# Patient Record
Sex: Female | Born: 1955
Health system: Southern US, Community
[De-identification: ages and names within clinical notes are randomized; demographics above are authoritative.]

## PROBLEM LIST (undated history)

## (undated) DIAGNOSIS — S92909A Unspecified fracture of unspecified foot, initial encounter for closed fracture: Secondary | ICD-10-CM

## (undated) DIAGNOSIS — K219 Gastro-esophageal reflux disease without esophagitis: Secondary | ICD-10-CM

## (undated) DIAGNOSIS — Z860101 Personal history of adenomatous and serrated colon polyps: Secondary | ICD-10-CM

## (undated) DIAGNOSIS — R351 Nocturia: Secondary | ICD-10-CM

## (undated) DIAGNOSIS — Z86010 Personal history of colonic polyps: Secondary | ICD-10-CM

## (undated) DIAGNOSIS — M8580 Other specified disorders of bone density and structure, unspecified site: Principal | ICD-10-CM

## (undated) DIAGNOSIS — K642 Third degree hemorrhoids: Secondary | ICD-10-CM

## (undated) DIAGNOSIS — E559 Vitamin D deficiency, unspecified: Secondary | ICD-10-CM

## (undated) DIAGNOSIS — R7989 Other specified abnormal findings of blood chemistry: Secondary | ICD-10-CM

## (undated) DIAGNOSIS — H269 Unspecified cataract: Secondary | ICD-10-CM

## (undated) DIAGNOSIS — Z5189 Encounter for other specified aftercare: Secondary | ICD-10-CM

## (undated) HISTORY — PX: CATARACT EXTRACTION W/ INTRAOCULAR LENS IMPLANT: SHX1309

## (undated) HISTORY — PX: ABDOMINAL SACROCOLPOPEXY: SHX1114

## (undated) HISTORY — DX: Unspecified cataract: H26.9

## (undated) HISTORY — DX: Other specified abnormal findings of blood chemistry: R79.89

## (undated) HISTORY — DX: Encounter for other specified aftercare: Z51.89

## (undated) HISTORY — PX: COLONOSCOPY: SHX174

## (undated) HISTORY — DX: Unspecified fracture of unspecified foot, initial encounter for closed fracture: S92.909A

## (undated) HISTORY — DX: Other specified disorders of bone density and structure, unspecified site: M85.80

---

## 2000-02-02 HISTORY — PX: CYST REMOVAL NECK: SHX6281

## 2000-05-09 ENCOUNTER — Encounter: Payer: Self-pay | Admitting: *Deleted

## 2000-05-09 ENCOUNTER — Encounter: Admission: RE | Admit: 2000-05-09 | Discharge: 2000-05-09 | Payer: Self-pay | Admitting: *Deleted

## 2000-07-01 ENCOUNTER — Encounter (INDEPENDENT_AMBULATORY_CARE_PROVIDER_SITE_OTHER): Payer: Self-pay | Admitting: Specialist

## 2000-07-01 ENCOUNTER — Other Ambulatory Visit: Admission: RE | Admit: 2000-07-01 | Discharge: 2000-07-01 | Payer: Self-pay | Admitting: *Deleted

## 2004-02-02 DIAGNOSIS — Z5189 Encounter for other specified aftercare: Secondary | ICD-10-CM

## 2004-02-02 HISTORY — DX: Encounter for other specified aftercare: Z51.89

## 2004-05-14 ENCOUNTER — Emergency Department (HOSPITAL_COMMUNITY): Admission: EM | Admit: 2004-05-14 | Discharge: 2004-05-14 | Payer: Self-pay | Admitting: Emergency Medicine

## 2004-05-20 ENCOUNTER — Ambulatory Visit (HOSPITAL_COMMUNITY): Admission: RE | Admit: 2004-05-20 | Discharge: 2004-05-20 | Payer: Self-pay | Admitting: Family Medicine

## 2006-02-01 HISTORY — PX: CATARACT EXTRACTION: SUR2

## 2008-02-02 LAB — HM PAP SMEAR

## 2008-11-01 HISTORY — PX: ABDOMINAL HYSTERECTOMY: SHX81

## 2008-11-26 ENCOUNTER — Ambulatory Visit (HOSPITAL_COMMUNITY): Admission: RE | Admit: 2008-11-26 | Discharge: 2008-11-26 | Payer: Self-pay | Admitting: Obstetrics and Gynecology

## 2008-11-26 ENCOUNTER — Encounter: Payer: Self-pay | Admitting: Obstetrics and Gynecology

## 2008-11-26 HISTORY — PX: ROBOTIC ASSISTED TOTAL HYSTERECTOMY: SHX6085

## 2010-01-28 ENCOUNTER — Ambulatory Visit: Payer: Self-pay | Admitting: Family Medicine

## 2010-05-07 LAB — CBC
HCT: 44 % (ref 36.0–46.0)
Hemoglobin: 15 g/dL (ref 12.0–15.0)
MCHC: 34.1 g/dL (ref 30.0–36.0)
MCV: 93.6 fL (ref 78.0–100.0)
Platelets: 223 10*3/uL (ref 150–400)
RBC: 4.7 MIL/uL (ref 3.87–5.11)
RDW: 12.9 % (ref 11.5–15.5)
WBC: 5 10*3/uL (ref 4.0–10.5)

## 2010-05-07 LAB — TYPE AND SCREEN: Antibody Screen: NEGATIVE

## 2010-05-07 LAB — ABO/RH: ABO/RH(D): A POS

## 2010-06-02 HISTORY — PX: ROBOTIC ASSISTED LAPAROSCOPIC SACROCOLPOPEXY: SHX5388

## 2010-07-01 HISTORY — PX: ROBOTIC ASSISTED LAPAROSCOPIC SACROCOLPOPEXY: SHX5388

## 2011-06-02 ENCOUNTER — Encounter: Payer: Self-pay | Admitting: Obstetrics and Gynecology

## 2011-08-30 LAB — HM MAMMOGRAPHY: HM Mammogram: NEGATIVE

## 2011-09-01 ENCOUNTER — Encounter: Payer: Self-pay | Admitting: Internal Medicine

## 2012-05-08 ENCOUNTER — Encounter: Payer: Self-pay | Admitting: Family Medicine

## 2012-07-07 ENCOUNTER — Encounter: Payer: Self-pay | Admitting: Internal Medicine

## 2012-07-07 LAB — HM COLONOSCOPY

## 2012-11-23 ENCOUNTER — Telehealth: Payer: Self-pay | Admitting: Obstetrics and Gynecology

## 2012-11-23 NOTE — Telephone Encounter (Signed)
[employer] calling  For claim 04/06/12 is being requested to be resent and they are behind right now and she just wanted to make sure that the pt wasn't being sent to collections. Any questions please call and wanted [place] to advise the pt too.

## 2012-12-27 ENCOUNTER — Other Ambulatory Visit (INDEPENDENT_AMBULATORY_CARE_PROVIDER_SITE_OTHER): Payer: BC Managed Care – PPO

## 2012-12-27 DIAGNOSIS — Z23 Encounter for immunization: Secondary | ICD-10-CM

## 2013-04-09 ENCOUNTER — Encounter: Payer: Self-pay | Admitting: Obstetrics and Gynecology

## 2013-04-09 ENCOUNTER — Ambulatory Visit (INDEPENDENT_AMBULATORY_CARE_PROVIDER_SITE_OTHER): Payer: BC Managed Care – PPO | Admitting: Obstetrics and Gynecology

## 2013-04-09 VITALS — BP 100/68 | HR 64 | Resp 16 | Ht 62.0 in | Wt 127.5 lb

## 2013-04-09 DIAGNOSIS — Z0000 Encounter for general adult medical examination without abnormal findings: Secondary | ICD-10-CM

## 2013-04-09 DIAGNOSIS — I781 Nevus, non-neoplastic: Secondary | ICD-10-CM

## 2013-04-09 DIAGNOSIS — Z01419 Encounter for gynecological examination (general) (routine) without abnormal findings: Secondary | ICD-10-CM

## 2013-04-09 DIAGNOSIS — E559 Vitamin D deficiency, unspecified: Secondary | ICD-10-CM

## 2013-04-09 DIAGNOSIS — T83711A Erosion of implanted vaginal mesh and other prosthetic materials to surrounding organ or tissue, initial encounter: Secondary | ICD-10-CM

## 2013-04-09 DIAGNOSIS — D1801 Hemangioma of skin and subcutaneous tissue: Secondary | ICD-10-CM

## 2013-04-09 DIAGNOSIS — E785 Hyperlipidemia, unspecified: Secondary | ICD-10-CM

## 2013-04-09 LAB — CBC
HCT: 41.9 % (ref 36.0–46.0)
HEMOGLOBIN: 14.7 g/dL (ref 12.0–15.0)
MCH: 32.1 pg (ref 26.0–34.0)
MCHC: 35.1 g/dL (ref 30.0–36.0)
MCV: 91.5 fL (ref 78.0–100.0)
Platelets: 204 10*3/uL (ref 150–400)
RBC: 4.58 MIL/uL (ref 3.87–5.11)
RDW: 13.3 % (ref 11.5–15.5)
WBC: 4.6 10*3/uL (ref 4.0–10.5)

## 2013-04-09 LAB — LIPID PANEL
CHOLESTEROL: 163 mg/dL (ref 0–200)
HDL: 48 mg/dL (ref >39)
LDL CALC: 100 mg/dL — AB (ref 0–99)
TRIGLYCERIDES: 75 mg/dL (ref <150)
Total CHOL/HDL Ratio: 3.4 Ratio
VLDL: 15 mg/dL (ref 0–40)

## 2013-04-09 LAB — POCT URINALYSIS DIPSTICK
BILIRUBIN UA: NEGATIVE
Blood, UA: NEGATIVE
GLUCOSE UA: NEGATIVE
KETONES UA: NEGATIVE
LEUKOCYTES UA: NEGATIVE
NITRITE UA: NEGATIVE
PH UA: 5
Protein, UA: NEGATIVE
Urobilinogen, UA: NEGATIVE

## 2013-04-09 LAB — COMPREHENSIVE METABOLIC PANEL
ALBUMIN: 4.2 g/dL (ref 3.5–5.2)
ALT: 19 U/L (ref 0–35)
AST: 23 U/L (ref 0–37)
Alkaline Phosphatase: 58 U/L (ref 39–117)
BUN: 15 mg/dL (ref 6–23)
CALCIUM: 9.2 mg/dL (ref 8.4–10.5)
CHLORIDE: 103 meq/L (ref 96–112)
CO2: 33 meq/L — AB (ref 19–32)
Creat: 0.76 mg/dL (ref 0.50–1.10)
Glucose, Bld: 93 mg/dL (ref 70–99)
POTASSIUM: 4.4 meq/L (ref 3.5–5.3)
Sodium: 139 mEq/L (ref 135–145)
Total Bilirubin: 0.8 mg/dL (ref 0.2–1.2)
Total Protein: 7.2 g/dL (ref 6.0–8.3)

## 2013-04-09 LAB — HEMOGLOBIN, FINGERSTICK: HEMOGLOBIN, FINGERSTICK: 14.5 g/dL (ref 12.0–16.0)

## 2013-04-09 LAB — HEMOGLOBIN A1C
HEMOGLOBIN A1C: 5.7 % — AB (ref <5.7)
MEAN PLASMA GLUCOSE: 117 mg/dL — AB (ref <117)

## 2013-04-09 NOTE — Progress Notes (Signed)
Patient ID: Laura Baker, female   DOB: 12/24/1955, 58 y.o.   MRN: [id number] GYNECOLOGY VISIT  PCP:   Jill Alexanders, MD  Referring provider:   HPI: 58 y.o.   Married  Caucasian  female   [phone] with No LMP recorded. Patient has had a hysterectomy.   here for annual exam. Patient had a vaginal cuff erosion from the mesh.  Dr. Yolonda Kida removed a stitch in the office.  Sees him every 6 months.  Has a prescription of Estrace cream from him.  No problems with bladder and bowel function.   Wants labs for work.   Wants evaluation for lesions on face.   Hgb:    14.5 Urine:  Neg  GYNECOLOGIC HISTORY: No LMP recorded. Patient has had a hysterectomy.  Ovaries remain.  Sexually active:  yes Partner preference: female Contraception:  hysterectomy  Menopausal hormone therapy: Estrace vaginal cream twice weekly DES exposure:   no Blood transfusions:  Yes in 2006 unclear of reason  Sexually transmitted diseases:   no GYN procedures and prior surgeries:  Hysterectomy, Robotic assisted laparoscopic sacrocolpopexy 06/2010. Last mammogram:   08-30-11 VPX:[id number]              Last pap and high risk HPV testing:  2010 wnl:no HPV testing  History of abnormal pap smear:  no   OB History   Grav Para Term Preterm Abortions TAB SAB Ect Mult Living   3 3 3       3        LIFESTYLE: Exercise:   Walking.              Tobacco:   no Alcohol:     no Drug use:  no  OTHER HEALTH MAINTENANCE: Tetanus/TDap:   2010 Gardisil:              n/a Influenza:            With PCP Zostavax:             n/a  Bone density:      never Colonoscopy:      07-07-12 with Dr. Collene Mares revealed polyp.  Next colonoscopy 07/2017.  Cholesterol check:  2014 wnl   Family History  Problem Relation Age of Onset  . Hypertension Mother   . Congestive Heart Failure Mother   . Thyroid disease Mother   . Osteoporosis Mother   . Stroke Mother   . Diabetes Mother   . Lung cancer Father   . Diabetes Sister   . Thyroid disease  Sister   . Osteoporosis Sister   . Thyroid disease Brother   . Leukemia Sister   . Thyroid disease Sister   . Diabetes Sister   . Kidney disease Sister   . Scleroderma Sister   . Thyroid disease Brother   . Rashes / Skin problems Other     There are no active problems to display for this patient.  Past Medical History  Diagnosis Date  . Blood transfusion without reported diagnosis 2006    Past Surgical History  Procedure Laterality Date  . Robotic assisted laparoscopic sacrocolpopexy  06/2010    --L.[facility]  . Abdominal hysterectomy  11/2008  . Cyst removal neck  2002  . Cataract surgery  2008    ALLERGIES: Sulfa antibiotics  Current Outpatient Prescriptions  Medication Sig Dispense Refill  . cholecalciferol (VITAMIN D) 1000 UNITS tablet Take 1,000 Units by mouth daily.      Marland Kitchen ESTRACE VAGINAL 0.1 MG/GM vaginal cream  Place 1 Applicatorful vaginally 2 (two) times a week.       No current facility-administered medications for this visit.     ROS:  Pertinent items are noted in HPI.  SOCIAL HISTORY:  International aid/development worker.   PHYSICAL EXAMINATION:    BP 100/68  Pulse 64  Resp 16  Ht 5\' 2"  (1.575 m)  Wt 127 lb 8 oz (57.834 kg)  BMI 23.31 kg/m2   Wt Readings from Last 3 Encounters:  04/09/13 127 lb 8 oz (57.834 kg)     Ht Readings from Last 3 Encounters:  04/09/13 5\' 2"  (1.575 m)    General appearance: alert, cooperative and appears stated age Head: Normocephalic, without obvious abnormality, atraumatic Neck: no adenopathy, supple, symmetrical, trachea midline and thyroid not enlarged, symmetric, no tenderness/mass/nodules Lungs: clear to auscultation bilaterally Breasts: Inspection negative, No nipple retraction or dimpling, No nipple discharge or bleeding, No axillary or supraclavicular adenopathy, Normal to palpation without dominant masses Heart: regular rate and rhythm Abdomen: soft, non-tender; no masses,  no organomegaly Extremities: extremities normal, atraumatic,  no cyanosis or edema Skin: Skin color, texture, turgor normal. Cherry hemangiomas of face and vulva.  Lymph nodes: Cervical, supraclavicular, and axillary nodes normal. No abnormal inguinal nodes palpated Neurologic: Grossly normal  Pelvic: External genitalia:  Multiple cherry hemangiomas.               Urethra:  normal appearing urethra with no masses, tenderness or lesions              Bartholins and Skenes: normal                 Vagina:  Right vaginal cuff mesh erosion and suture visible.              Cervix: absent              Pap and high risk HPV testing done: no.            Bimanual Exam:  Uterus:  Absent.                                       Adnexa: normal adnexa in size, nontender and no masses                                      Rectovaginal: Confirms                                      Anus:  normal sphincter tone, no lesions  ASSESSMENT  Normal gynecologic exam. Right vaginal cuff mesh erosion.  Cherry hemangiomas.   Hyperlipidemia.  Family history of thyroid disease.  PLAN  Mammogram recommended yearly.  Patient will call [facility].  Pap smear and high risk HPV testing not indicated. Patient educated to vaginal cuff mesh erosion.   Follow up with Dr. Yolonda Kida.   Continue vaginal Estrace.  Has Rx from Dr. Yolonda Kida.  Referral to Dermatology for cherry hemangiomas of face.  Lipid profile, CMP, HgbA1C, TSH, CBC, Vit D.  Counseled on self breast exam, Calcium and vitamin D intake, exercise. Return annually or prn   An After Visit Summary was printed and given to the patient.

## 2013-04-09 NOTE — Patient Instructions (Signed)

## 2013-04-10 LAB — VITAMIN D 25 HYDROXY (VIT D DEFICIENCY, FRACTURES): VIT D 25 HYDROXY: 63 ng/mL (ref 30–89)

## 2013-04-10 LAB — TSH: TSH: 2.369 u[IU]/mL (ref 0.350–4.500)

## 2013-04-11 ENCOUNTER — Telehealth: Payer: Self-pay | Admitting: Obstetrics and Gynecology

## 2013-04-11 NOTE — Telephone Encounter (Signed)
Advised patient that she is schedule with [facility] on May 18th @ [phone]

## 2013-06-18 ENCOUNTER — Encounter: Payer: Self-pay | Admitting: Internal Medicine

## 2013-06-18 LAB — HM MAMMOGRAPHY: HM Mammogram: NEGATIVE

## 2013-11-14 ENCOUNTER — Telehealth: Payer: Self-pay | Admitting: Obstetrics and Gynecology

## 2013-11-14 NOTE — Telephone Encounter (Signed)
Pt wants to know if she can come in for a morning appointment for fasting labs on 04/12/14. She has her annual scheduled at 1:30 on that day. Will she need an order?

## 2013-11-14 NOTE — Telephone Encounter (Signed)
Left message to call [place] at [phone].   Triage to make sure no concerns or problems. Are there any labs that she is interested in? At last AEX patient had lipid panel, TSh, CMP, CBC, Vit D, and HA1c done.

## 2013-11-16 NOTE — Telephone Encounter (Signed)
Patient wanted to have annual fasting blood work and annual exam at the same time. Offered earlier annual exam appointment the same day for 1130 and can discuss what types of lab work she will need with Dr. Quincy Simmonds at time of appointment. Patient is agreeable to this. Routing to provider for final review. Patient agreeable to disposition. Will close encounter

## 2013-12-03 ENCOUNTER — Encounter: Payer: Self-pay | Admitting: Obstetrics and Gynecology

## 2014-03-28 ENCOUNTER — Telehealth: Payer: Self-pay | Admitting: Obstetrics and Gynecology

## 2014-03-28 DIAGNOSIS — Z0189 Encounter for other specified special examinations: Secondary | ICD-10-CM

## 2014-03-28 NOTE — Telephone Encounter (Signed)
Pt has an aex scheduled 04/12/14. Pt states she is bringing forms for insurance that she needs filled out. Pt would like to make sure any blood work is back that day to fill out forms and asks when she should have labs.  Pt states she will not be home today and requests call tomorrow 03/29/14 to discuss.

## 2014-03-29 NOTE — Telephone Encounter (Signed)
I will approve the patient's request for these labs.  Please place a future order.

## 2014-03-29 NOTE — Telephone Encounter (Signed)
Left message to call [place] at [phone]. 

## 2014-03-29 NOTE — Telephone Encounter (Signed)
Returning call.

## 2014-03-29 NOTE — Telephone Encounter (Signed)
Spoke with patient. Patient states that she would like to come in early for lab work so that it will be available at her annual on 3/11. Lab appointment scheduled for 3/9 at 9am. Patient is agreeable to date and time. Patient requesting to have all labs from last year repeated which include;lipid panel, TSH, CMP, CBC, Vit D, and HgbA1c. Orders pending Dr.Silva's review.

## 2014-03-29 NOTE — Telephone Encounter (Signed)
Orders placed.  Will close encounter.

## 2014-04-10 ENCOUNTER — Other Ambulatory Visit (INDEPENDENT_AMBULATORY_CARE_PROVIDER_SITE_OTHER): Payer: BLUE CROSS/BLUE SHIELD

## 2014-04-10 DIAGNOSIS — Z0000 Encounter for general adult medical examination without abnormal findings: Secondary | ICD-10-CM

## 2014-04-10 DIAGNOSIS — Z0189 Encounter for other specified special examinations: Secondary | ICD-10-CM

## 2014-04-10 LAB — COMPREHENSIVE METABOLIC PANEL
ALK PHOS: 60 U/L (ref 39–117)
ALT: 20 U/L (ref 0–35)
AST: 24 U/L (ref 0–37)
Albumin: 3.9 g/dL (ref 3.5–5.2)
BUN: 14 mg/dL (ref 6–23)
CALCIUM: 9.5 mg/dL (ref 8.4–10.5)
CO2: 26 mEq/L (ref 19–32)
Chloride: 102 mEq/L (ref 96–112)
Creat: 0.8 mg/dL (ref 0.50–1.10)
GLUCOSE: 84 mg/dL (ref 70–99)
Potassium: 4.4 mEq/L (ref 3.5–5.3)
Sodium: 141 mEq/L (ref 135–145)
TOTAL PROTEIN: 7.1 g/dL (ref 6.0–8.3)
Total Bilirubin: 0.6 mg/dL (ref 0.2–1.2)

## 2014-04-10 LAB — CBC WITH DIFFERENTIAL/PLATELET
BASOS PCT: 1 % (ref 0–1)
Basophils Absolute: 0 10*3/uL (ref 0.0–0.1)
Eosinophils Absolute: 0.1 10*3/uL (ref 0.0–0.7)
Eosinophils Relative: 3 % (ref 0–5)
HCT: 43.9 % (ref 36.0–46.0)
HEMOGLOBIN: 14.9 g/dL (ref 12.0–15.0)
LYMPHS PCT: 27 % (ref 12–46)
Lymphs Abs: 1.3 10*3/uL (ref 0.7–4.0)
MCH: 31.2 pg (ref 26.0–34.0)
MCHC: 33.9 g/dL (ref 30.0–36.0)
MCV: 92 fL (ref 78.0–100.0)
MONO ABS: 0.4 10*3/uL (ref 0.1–1.0)
MONOS PCT: 8 % (ref 3–12)
MPV: 9.9 fL (ref 8.6–12.4)
NEUTROS ABS: 2.9 10*3/uL (ref 1.7–7.7)
NEUTROS PCT: 61 % (ref 43–77)
PLATELETS: 250 10*3/uL (ref 150–400)
RBC: 4.77 MIL/uL (ref 3.87–5.11)
RDW: 13.5 % (ref 11.5–15.5)
WBC: 4.7 10*3/uL (ref 4.0–10.5)

## 2014-04-10 LAB — LIPID PANEL
CHOL/HDL RATIO: 3.1 ratio
CHOLESTEROL: 149 mg/dL (ref 0–200)
HDL: 48 mg/dL (ref >=46)
LDL Cholesterol: 77 mg/dL (ref 0–99)
Triglycerides: 118 mg/dL (ref <150)
VLDL: 24 mg/dL (ref 0–40)

## 2014-04-10 LAB — TSH: TSH: 2.317 u[IU]/mL (ref 0.350–4.500)

## 2014-04-10 LAB — HEMOGLOBIN A1C
Hgb A1c MFr Bld: 5.6 % (ref <5.7)
Mean Plasma Glucose: 114 mg/dL (ref <117)

## 2014-04-11 LAB — VITAMIN D 25 HYDROXY (VIT D DEFICIENCY, FRACTURES): Vit D, 25-Hydroxy: 29 ng/mL — ABNORMAL LOW (ref 30–100)

## 2014-04-12 ENCOUNTER — Encounter: Payer: Self-pay | Admitting: Obstetrics and Gynecology

## 2014-04-12 ENCOUNTER — Ambulatory Visit: Payer: BC Managed Care – PPO | Admitting: Obstetrics and Gynecology

## 2014-04-12 ENCOUNTER — Ambulatory Visit (INDEPENDENT_AMBULATORY_CARE_PROVIDER_SITE_OTHER): Payer: BLUE CROSS/BLUE SHIELD | Admitting: Obstetrics and Gynecology

## 2014-04-12 ENCOUNTER — Telehealth: Payer: Self-pay | Admitting: Obstetrics and Gynecology

## 2014-04-12 VITALS — BP 118/80 | HR 80 | Resp 18 | Ht 62.0 in | Wt 124.4 lb

## 2014-04-12 DIAGNOSIS — Z0000 Encounter for general adult medical examination without abnormal findings: Secondary | ICD-10-CM | POA: Diagnosis not present

## 2014-04-12 DIAGNOSIS — T8589XD Other specified complication of internal prosthetic devices, implants and grafts, not elsewhere classified, subsequent encounter: Secondary | ICD-10-CM | POA: Diagnosis not present

## 2014-04-12 DIAGNOSIS — E559 Vitamin D deficiency, unspecified: Secondary | ICD-10-CM | POA: Diagnosis not present

## 2014-04-12 DIAGNOSIS — T83711D Erosion of implanted vaginal mesh and other prosthetic materials to surrounding organ or tissue, subsequent encounter: Secondary | ICD-10-CM

## 2014-04-12 DIAGNOSIS — Z01419 Encounter for gynecological examination (general) (routine) without abnormal findings: Secondary | ICD-10-CM

## 2014-04-12 LAB — POCT URINALYSIS DIPSTICK
BILIRUBIN UA: NEGATIVE
Blood, UA: NEGATIVE
GLUCOSE UA: NEGATIVE
Ketones, UA: NEGATIVE
Leukocytes, UA: NEGATIVE
Nitrite, UA: NEGATIVE
Protein, UA: NEGATIVE
UROBILINOGEN UA: NEGATIVE
pH, UA: 5

## 2014-04-12 MED ORDER — ESTRADIOL 0.1 MG/GM VA CREA: TOPICAL_CREAM | VAGINAL | Status: DC

## 2014-04-12 NOTE — Telephone Encounter (Signed)
yes

## 2014-04-12 NOTE — Telephone Encounter (Signed)
Patient scheduled her appointment for aex 3/13/17w/ Laura Baker next year (due to patient schedule). Patient asked to come in a few days prior for labs. Patient scheduled for labs 04/10/15.  Patient had aex today with Dr.Silva, need order for next years labs.

## 2014-04-12 NOTE — Progress Notes (Signed)
Patient ID: Laura Baker, female   DOB: 01-26-1956, 59 y.o.   MRN: [id number] 59 y.o. [id number] MarriedCaucasianF here for annual exam.   PCP:  Jill Alexanders, MD  Had blood work done in preparation for the visit today.  Labs normal except vit D 29.  Needs them for work.  Brings in paperwork to be signed today for work that need to be turned in beginning of next week.   History of mesh erosion from prior sacrocolpopexy.  Saw her GYN in [place] who prescribed estrogen cream and told it was OK and to call if she developed bleeding.  Using Estrace 1/2 gram pv three times a day.  No pain with intercourse.  No vaginal bleeding.   Babysits for two children. Daughter just married, and patient is hoping for grandchildren.    No LMP recorded. Patient has had a hysterectomy.          Sexually active: Yes.   female partner The current method of family planning is status post hysterectomy.   Ovaries remain.  Exercising: Yes.    walking. Smoker:  no  Health Maintenance: Pap: 2010 wnl History of abnormal Pap:  no MMG:  06-18-13 heterogeneously dense/nl:[facility] Colonoscopy:  07-07-12 revealed a polyp with Dr. Collene Mares. Next due 07/2017. BMD:   never TDaP:  2010 Screening Labs: Hb today: 14.9, Urine today: Neg   reports that she has never smoked. She does not have any smokeless tobacco history on file. She reports that she does not drink alcohol or use illicit drugs.  Past Medical History  Diagnosis Date  . Blood transfusion without reported diagnosis 2006    Past Surgical History  Procedure Laterality Date  . Robotic assisted laparoscopic sacrocolpopexy  06/2010    --L[facility]  . Abdominal hysterectomy  11/2008  . Cyst removal neck  2002  . Cataract surgery  2008    Current Outpatient Prescriptions  Medication Sig Dispense Refill  . Multiple Vitamins-Minerals (CENTRUM SILVER ADULT 50+ PO) Take 1 tablet by mouth daily.    Marland Kitchen ESTRACE VAGINAL 0.1 MG/GM vaginal cream Place 1 Applicatorful  vaginally 2 (two) times a week.     No current facility-administered medications for this visit.    Family History  Problem Relation Age of Onset  . Hypertension Mother   . Congestive Heart Failure Mother   . Thyroid disease Mother   . Osteoporosis Mother   . Stroke Mother   . Diabetes Mother   . Lung cancer Father   . Diabetes Sister   . Thyroid disease Sister   . Osteoporosis Sister   . Thyroid disease Brother   . Leukemia Sister   . Thyroid disease Sister   . Diabetes Sister   . Kidney disease Sister   . Scleroderma Sister   . Thyroid disease Brother   . Rashes / Skin problems Other     ROS:  Pertinent items are noted in HPI.  Otherwise, a comprehensive ROS was negative.  Exam:   BP 118/80 mmHg  Pulse 80  Resp 18  Ht 5\' 2"  (1.575 m)  Wt 124 lb 6.4 oz (56.427 kg)  BMI 22.75 kg/m2     Height: 5\' 2"  (157.5 cm)  Ht Readings from Last 3 Encounters:  04/12/14 5\' 2"  (1.575 m)  04/09/13 5\' 2"  (1.575 m)    General appearance: alert, cooperative and appears stated age Head: Normocephalic, without obvious abnormality, atraumatic Neck: no adenopathy, supple, symmetrical, trachea midline and thyroid normal to inspection and palpation Lungs:  clear to auscultation bilaterally Breasts: normal appearance, no masses or tenderness, Inspection negative, No nipple retraction or dimpling, No nipple discharge or bleeding, No axillary or supraclavicular adenopathy Heart: regular rate and rhythm Abdomen: soft, non-tender; bowel sounds normal; no masses,  no organomegaly Extremities: extremities normal, atraumatic, no cyanosis or edema Skin: Skin color, texture, turgor normal. No rashes or lesions Lymph nodes: Cervical, supraclavicular, and axillary nodes normal. No abnormal inguinal nodes palpated Neurologic: Grossly normal   Pelvic: External genitalia:  no lesions              Urethra:  normal appearing urethra with no masses, tenderness or lesions              Bartholins and  Skenes: normal                 Vagina: normal appearing vagina with normal color and discharge,  Mesh erosion of the right cuff is palpable.               Cervix: absent              Pap taken: No. Bimanual Exam:  Uterus:  uterus absent              Adnexa: no mass, fullness, tenderness               Rectovaginal: Confirms               Anus:  normal sphincter tone, no lesions  Chaperone was present for exam.  A:  Well Woman with normal exam Status post TAH.  Ovaries remain.  Sacrocolpopexy with vaginal mesh erosion.   Borderline low vit D.   P:   Mammogram yearly.  Do self beast exams.  pap smear not indicated.  Rx for Estrace vaginal cream 1/2 gm pv at hs two to three times weekly.  Discussed risk of DVT, PE, MI, stroke, breast cancer.  Recommended adding vit D 1000 IU daily to vitamin routine.  return annually or prn

## 2014-04-12 NOTE — Telephone Encounter (Signed)
All labs ordered for future appointment.  Routing to provider for final review. Patient agreeable to disposition. Will close encounter

## 2014-04-12 NOTE — Patient Instructions (Signed)

## 2014-04-12 NOTE — Telephone Encounter (Signed)
Patient was seen with Dr.Silva for annual on 04/10/2014. Patient had Lipid panel, CMET, TSH, CBC with differential, Vit D, and HgBA1c done. Patient is scheduled to see Regina Eck CNM for next annual on 04/14/2015. Has a lab appointment scheduled for 04/10/2015.   Okay to reorder all labs she had performed at this years appointment for next year?

## 2014-04-19 ENCOUNTER — Ambulatory Visit: Payer: BC Managed Care – PPO | Admitting: Obstetrics and Gynecology

## 2014-05-03 HISTORY — PX: CATARACT EXTRACTION: SUR2

## 2014-06-20 LAB — HM MAMMOGRAPHY: HM MAMMO: NEGATIVE

## 2014-07-15 ENCOUNTER — Encounter: Payer: Self-pay | Admitting: Internal Medicine

## 2014-11-30 ENCOUNTER — Ambulatory Visit (INDEPENDENT_AMBULATORY_CARE_PROVIDER_SITE_OTHER): Payer: BLUE CROSS/BLUE SHIELD | Admitting: Family Medicine

## 2014-11-30 VITALS — BP 118/82 | HR 78 | Temp 98.8°F | Resp 18 | Ht 62.75 in | Wt 130.0 lb

## 2014-11-30 DIAGNOSIS — M62838 Other muscle spasm: Secondary | ICD-10-CM

## 2014-11-30 DIAGNOSIS — M6248 Contracture of muscle, other site: Secondary | ICD-10-CM

## 2014-11-30 MED ORDER — CYCLOBENZAPRINE HCL 5 MG PO TABS: 2.5000 mg | ORAL_TABLET | Freq: Three times a day (TID) | ORAL | Status: DC | PRN

## 2014-11-30 MED ORDER — MELOXICAM 15 MG PO TABS: 15.0000 mg | ORAL_TABLET | Freq: Every day | ORAL | Status: DC

## 2014-11-30 NOTE — Progress Notes (Addendum)
Subjective:    Patient ID: Laura Baker, female    DOB: 09-11-1955, 59 y.o.   MRN: [id number] This chart was scribed for Delman Cheadle, MD by Marti Sleigh, Medical Scribe. This patient was seen in Room 10 and the patient's care was started a 10:24 AM.  Chief Complaint  Patient presents with  . Headache    started on Wednesday, feels a pain that starts on the left side and spreads down her neck, back and arm.      HPI HPI Comments: Laura Baker is a 59 y.o. female who presents to [facility] complaining of pain in the left side of her neck that radiates down the left side of her back. She states the pain started three days ago and initially radiated into her shoulder. She has no past hx of these sx. She took an aleve last night without relief. She endorses associated sleep disturbance and HA. Denies vision changes, generalized weakness, congestion, sinus pressure, weakness or numbness in arms or legs, or loss of bowel or bladder control. She states she changed her pillow two months ago the new pillow is slightly higher than her old one.   Past Medical History  Diagnosis Date  . Blood transfusion without reported diagnosis 2006  . Cataract    Allergies  Allergen Reactions  . Sulfa Antibiotics Hives   Current Outpatient Prescriptions on File Prior to Visit  Medication Sig Dispense Refill  . Multiple Vitamins-Minerals (CENTRUM SILVER ADULT 50+ PO) Take 1 tablet by mouth daily.    Marland Kitchen estradiol (ESTRACE VAGINAL) 0.1 MG/GM vaginal cream Place 1/2 gram per vagina at bedtime 2 - 3 times per week. (Patient not taking: Reported on 11/30/2014) 42.5 g 3   No current facility-administered medications on file prior to visit.    Review of Systems  Constitutional: Negative for fever and chills.  Genitourinary: Negative for dysuria, frequency and flank pain.  Musculoskeletal: Positive for back pain, neck pain and neck stiffness.  Neurological: Positive for headaches. Negative for weakness and  numbness.       Objective:  BP 118/82 mmHg  Pulse 78  Temp(Src) 98.8 F (37.1 C) (Oral)  Resp 18  Ht 5' 2.75" (1.594 m)  Wt 130 lb (58.968 kg)  BMI 23.21 kg/m2  SpO2 98%  Physical Exam  Constitutional: She is oriented to person, place, and time. She appears well-developed and well-nourished. No distress.  HENT:  Head: Normocephalic and atraumatic.  Normal TMs, nares, oropharynx.   Eyes: Pupils are equal, round, and reactive to light.  Neck: Neck supple.  Cardiovascular: Normal rate.   Pulmonary/Chest: Effort normal. No respiratory distress.  Musculoskeletal: Normal range of motion. She exhibits no edema or tenderness.  Neurological: She is alert and oriented to person, place, and time. She has normal strength and normal reflexes. She displays no atrophy and normal reflexes. No cranial nerve deficit or sensory deficit. She exhibits abnormal muscle tone. Coordination and gait normal.  Cranial nerves 2-10 intact, limitation in left  strength due to pain. No point tenderness over cervial or thoracic spinous processes. Negative spurlings. No pre or post auricular adenopathy. No tenderness over mastoid or mandible. Normal thyroid, no cervical adenopathy. Large palpable spasm over the proximal left trapezius muscle. 2+ bicep, tricep and brachioradialis. 5/5 opposition and grasp strength in bilateral hands.   Skin: Skin is warm and dry. No rash noted. She is not diaphoretic.  Psychiatric: She has a normal mood and affect. Her behavior is normal.  Nursing note  and vitals reviewed.     Assessment & Plan:   1. Trapezius muscle spasm   heat and gentle stretching.  Meds ordered this encounter  Medications  . Cholecalciferol (VITAMIN D PO)    Sig: Take by mouth.  . meloxicam (MOBIC) 15 MG tablet    Sig: Take 1 tablet (15 mg total) by mouth daily.    Dispense:  30 tablet    Refill:  0  . cyclobenzaprine (FLEXERIL) 5 MG tablet    Sig: Take 0.5-2 tablets (2.5-10 mg total) by mouth 3  (three) times daily as needed for muscle spasms.    Dispense:  30 tablet    Refill:  1    I personally performed the services described in this documentation, which was scribed in my presence. The recorded information has been reviewed and considered, and addended by me as needed.  Delman Cheadle, MD MPH   By signing my name below, I, Judithe Modest, attest that this documentation has been prepared under the direction and in the presence of Delman Cheadle, MD. Electronically Signed: Judithe Modest, ER Scribe. 11/30/2014. 10:32 AM.

## 2014-11-30 NOTE — Patient Instructions (Signed)
Acute Torticollis Torticollis is a condition in which the muscles of the neck tighten (contract) abnormally, causing the neck to twist and the head to move into an unnatural position. Torticollis that develops suddenly is called acute torticollis. If torticollis becomes chronic and is left untreated, the face and neck can become deformed. CAUSES This condition may be caused by:  Sleeping in an awkward position (common).  Extending or twisting the neck muscles beyond their normal position.  Infection. In some cases, the cause may not be known. SYMPTOMS Symptoms of this condition include:  An unnatural position of the head.  Neck pain.  A limited ability to move the neck.  Twisting of the neck to one side. DIAGNOSIS This condition is diagnosed with a physical exam. You may also have imaging tests, such as an X-ray, CT scan, or MRI. TREATMENT Treatment for this condition involves trying to relax the neck muscles. It may include:  Medicines or shots.  Physical therapy.  Surgery. This may be done in severe cases. HOME CARE INSTRUCTIONS  Take medicines only as directed by your health care provider.  Do stretching exercises and massage your neck as directed by your health care provider.  Keep all follow-up visits as directed by your health care provider. This is important. SEEK MEDICAL CARE IF:  You develop a fever. SEEK IMMEDIATE MEDICAL CARE IF:  You develop difficulty breathing.  You develop noisy breathing (stridor).  You start drooling.  You have trouble swallowing or have pain with swallowing.  You develop numbness or weakness in your hands or feet.  You have changes in your speech, understanding, or vision.  Your pain gets worse.   This information is not intended to replace advice given to you by your health care provider. Make sure you discuss any questions you have with your health care provider.   Document Released: 01/16/2000 Document Revised:  06/04/2014 Document Reviewed: 01/14/2014 Elsevier Interactive Patient Education 2016 [employer] Cervical Strain and Sprain With Rehab Cervical strain and sprain are injuries that commonly occur with "whiplash" injuries. Whiplash occurs when the neck is forcefully whipped backward or forward, such as during a motor vehicle accident or during contact sports. The muscles, ligaments, tendons, discs, and nerves of the neck are susceptible to injury when this occurs. RISK FACTORS Risk of having a whiplash injury increases if:  Osteoarthritis of the spine.  Situations that make head or neck accidents or trauma more likely.  High-risk sports (football, rugby, wrestling, hockey, auto racing, gymnastics, diving, contact karate, or boxing).  Poor strength and flexibility of the neck.  Previous neck injury.  Poor tackling technique.  Improperly fitted or padded equipment. SYMPTOMS   Pain or stiffness in the front or back of neck or both.  Symptoms may present immediately or up to 24 hours after injury.  Dizziness, headache, nausea, and vomiting.  Muscle spasm with soreness and stiffness in the neck.  Tenderness and swelling at the injury site. PREVENTION  Learn and use proper technique (avoid tackling with the head, spearing, and head-butting; use proper falling techniques to avoid landing on the head).  Warm up and stretch properly before activity.  Maintain physical fitness:  Strength, flexibility, and endurance.  Cardiovascular fitness.  Wear properly fitted and padded protective equipment, such as padded soft collars, for participation in contact sports. PROGNOSIS  Recovery from cervical strain and sprain injuries is dependent on the extent of the injury. These injuries are usually curable in 1 week to 3 months with appropriate treatment.  RELATED COMPLICATIONS   Temporary numbness and weakness may occur if the nerve roots are damaged, and this may persist until the nerve  has completely healed.  Chronic pain due to frequent recurrence of symptoms.  Prolonged healing, especially if activity is resumed too soon (before complete recovery). TREATMENT  Treatment initially involves the use of ice and medication to help reduce pain and inflammation. It is also important to perform strengthening and stretching exercises and modify activities that worsen symptoms so the injury does not get worse. These exercises may be performed at home or with a therapist. For patients who experience severe symptoms, a soft, padded collar may be recommended to be worn around the neck.  Improving your posture may help reduce symptoms. Posture improvement includes pulling your chin and abdomen in while sitting or standing. If you are sitting, sit in a firm chair with your buttocks against the back of the chair. While sleeping, try replacing your pillow with a small towel rolled to 2 inches in diameter, or use a cervical pillow or soft cervical collar. Poor sleeping positions delay healing.  For patients with nerve root damage, which causes numbness or weakness, the use of a cervical traction apparatus may be recommended. Surgery is rarely necessary for these injuries. However, cervical strain and sprains that are present at birth (congenital) may require surgery. MEDICATION   If pain medication is necessary, nonsteroidal anti-inflammatory medications, such as aspirin and ibuprofen, or other minor pain relievers, such as acetaminophen, are often recommended.  Do not take pain medication for 7 days before surgery.  Prescription pain relievers may be given if deemed necessary by your caregiver. Use only as directed and only as much as you need. HEAT AND COLD:   Cold treatment (icing) relieves pain and reduces inflammation. Cold treatment should be applied for 10 to 15 minutes every 2 to 3 hours for inflammation and pain and immediately after any activity that aggravates your symptoms. Use ice  packs or an ice massage.  Heat treatment may be used prior to performing the stretching and strengthening activities prescribed by your caregiver, physical therapist, or athletic trainer. Use a heat pack or a warm soak. SEEK MEDICAL CARE IF:   Symptoms get worse or do not improve in 2 weeks despite treatment.  New, unexplained symptoms develop (drugs used in treatment may produce side effects). EXERCISES RANGE OF MOTION (ROM) AND STRETCHING EXERCISES - Cervical Strain and Sprain These exercises may help you when beginning to rehabilitate your injury. In order to successfully resolve your symptoms, you must improve your posture. These exercises are designed to help reduce the forward-head and rounded-shoulder posture which contributes to this condition. Your symptoms may resolve with or without further involvement from your physician, physical therapist or athletic trainer. While completing these exercises, remember:   Restoring tissue flexibility helps normal motion to return to the joints. This allows healthier, less painful movement and activity.  An effective stretch should be held for at least 20 seconds, although you may need to begin with shorter hold times for comfort.  A stretch should never be painful. You should only feel a gentle lengthening or release in the stretched tissue. STRETCH- Axial Extensors  Lie on your back on the floor. You may bend your knees for comfort. Place a rolled-up hand towel or dish towel, about 2 inches in diameter, under the part of your head that makes contact with the floor.  Gently tuck your chin, as if trying to make a "double chin,"  until you feel a gentle stretch at the base of your head.  Hold __________ seconds. Repeat __________ times. Complete this exercise __________ times per day.  STRETCH - Axial Extension   Stand or sit on a firm surface. Assume a good posture: chest up, shoulders drawn back, abdominal muscles slightly tense, knees unlocked  (if standing) and feet hip width apart.  Slowly retract your chin so your head slides back and your chin slightly lowers. Continue to look straight ahead.  You should feel a gentle stretch in the back of your head. Be certain not to feel an aggressive stretch since this can cause headaches later.  Hold for __________ seconds. Repeat __________ times. Complete this exercise __________ times per day. STRETCH - Cervical Side Bend   Stand or sit on a firm surface. Assume a good posture: chest up, shoulders drawn back, abdominal muscles slightly tense, knees unlocked (if standing) and feet hip width apart.  Without letting your nose or shoulders move, slowly tip your right / left ear to your shoulder until your feel a gentle stretch in the muscles on the opposite side of your neck.  Hold __________ seconds. Repeat __________ times. Complete this exercise __________ times per day. STRETCH - Cervical Rotators   Stand or sit on a firm surface. Assume a good posture: chest up, shoulders drawn back, abdominal muscles slightly tense, knees unlocked (if standing) and feet hip width apart.  Keeping your eyes level with the ground, slowly turn your head until you feel a gentle stretch along the back and opposite side of your neck.  Hold __________ seconds. Repeat __________ times. Complete this exercise __________ times per day. RANGE OF MOTION - Neck Circles   Stand or sit on a firm surface. Assume a good posture: chest up, shoulders drawn back, abdominal muscles slightly tense, knees unlocked (if standing) and feet hip width apart.  Gently roll your head down and around from the back of one shoulder to the back of the other. The motion should never be forced or painful.  Repeat the motion 10-20 times, or until you feel the neck muscles relax and loosen. Repeat __________ times. Complete the exercise __________ times per day. STRENGTHENING EXERCISES - Cervical Strain and Sprain These exercises  may help you when beginning to rehabilitate your injury. They may resolve your symptoms with or without further involvement from your physician, physical therapist, or athletic trainer. While completing these exercises, remember:   Muscles can gain both the endurance and the strength needed for everyday activities through controlled exercises.  Complete these exercises as instructed by your physician, physical therapist, or athletic trainer. Progress the resistance and repetitions only as guided.  You may experience muscle soreness or fatigue, but the pain or discomfort you are trying to eliminate should never worsen during these exercises. If this pain does worsen, stop and make certain you are following the directions exactly. If the pain is still present after adjustments, discontinue the exercise until you can discuss the trouble with your clinician. STRENGTH - Cervical Flexors, Isometric  Face a wall, standing about 6 inches away. Place a small pillow, a ball about 6-8 inches in diameter, or a folded towel between your forehead and the wall.  Slightly tuck your chin and gently push your forehead into the soft object. Push only with mild to moderate intensity, building up tension gradually. Keep your jaw and forehead relaxed.  Hold 10 to 20 seconds. Keep your breathing relaxed.  Release the tension slowly. Relax your  neck muscles completely before you start the next repetition. Repeat __________ times. Complete this exercise __________ times per day. STRENGTH- Cervical Lateral Flexors, Isometric   Stand about 6 inches away from a wall. Place a small pillow, a ball about 6-8 inches in diameter, or a folded towel between the side of your head and the wall.  Slightly tuck your chin and gently tilt your head into the soft object. Push only with mild to moderate intensity, building up tension gradually. Keep your jaw and forehead relaxed.  Hold 10 to 20 seconds. Keep your breathing  relaxed.  Release the tension slowly. Relax your neck muscles completely before you start the next repetition. Repeat __________ times. Complete this exercise __________ times per day. STRENGTH - Cervical Extensors, Isometric   Stand about 6 inches away from a wall. Place a small pillow, a ball about 6-8 inches in diameter, or a folded towel between the back of your head and the wall.  Slightly tuck your chin and gently tilt your head back into the soft object. Push only with mild to moderate intensity, building up tension gradually. Keep your jaw and forehead relaxed.  Hold 10 to 20 seconds. Keep your breathing relaxed.  Release the tension slowly. Relax your neck muscles completely before you start the next repetition. Repeat __________ times. Complete this exercise __________ times per day. POSTURE AND BODY MECHANICS CONSIDERATIONS - Cervical Strain and Sprain Keeping correct posture when sitting, standing or completing your activities will reduce the stress put on different body tissues, allowing injured tissues a chance to heal and limiting painful experiences. The following are general guidelines for improved posture. Your physician or physical therapist will provide you with any instructions specific to your needs. While reading these guidelines, remember:  The exercises prescribed by your provider will help you have the flexibility and strength to maintain correct postures.  The correct posture provides the optimal environment for your joints to work. All of your joints have less wear and tear when properly supported by a spine with good posture. This means you will experience a healthier, less painful body.  Correct posture must be practiced with all of your activities, especially prolonged sitting and standing. Correct posture is as important when doing repetitive low-stress activities (typing) as it is when doing a single heavy-load activity (lifting). PROLONGED STANDING WHILE  SLIGHTLY LEANING FORWARD When completing a task that requires you to lean forward while standing in one place for a long time, place either foot up on a stationary 2- to 4-inch high object to help maintain the best posture. When both feet are on the ground, the low back tends to lose its slight inward curve. If this curve flattens (or becomes too large), then the back and your other joints will experience too much stress, fatigue more quickly, and can cause pain.  RESTING POSITIONS Consider which positions are most painful for you when choosing a resting position. If you have pain with flexion-based activities (sitting, bending, stooping, squatting), choose a position that allows you to rest in a less flexed posture. You would want to avoid curling into a fetal position on your side. If your pain worsens with extension-based activities (prolonged standing, working overhead), avoid resting in an extended position such as sleeping on your stomach. Most people will find more comfort when they rest with their spine in a more neutral position, neither too rounded nor too arched. Lying on a non-sagging bed on your side with a pillow between your knees, or  on your back with a pillow under your knees will often provide some relief. Keep in mind, being in any one position for a prolonged period of time, no matter how correct your posture, can still lead to stiffness. WALKING Walk with an upright posture. Your ears, shoulders, and hips should all line up. OFFICE WORK When working at a desk, create an environment that supports good, upright posture. Without extra support, muscles fatigue and lead to excessive strain on joints and other tissues. CHAIR:  A chair should be able to slide under your desk when your back makes contact with the back of the chair. This allows you to work closely.  The chair's height should allow your eyes to be level with the upper part of your monitor and your hands to be slightly lower  than your elbows.  Body position:  Your feet should make contact with the floor. If this is not possible, use a foot rest.  Keep your ears over your shoulders. This will reduce stress on your neck and low back.   This information is not intended to replace advice given to you by your health care provider. Make sure you discuss any questions you have with your health care provider.   Document Released: 01/18/2005 Document Revised: 02/08/2014 Document Reviewed: 05/02/2008 Elsevier Interactive Patient Education [employer].

## 2015-02-02 DIAGNOSIS — M8580 Other specified disorders of bone density and structure, unspecified site: Secondary | ICD-10-CM

## 2015-02-02 DIAGNOSIS — M810 Age-related osteoporosis without current pathological fracture: Secondary | ICD-10-CM

## 2015-02-02 HISTORY — DX: Age-related osteoporosis without current pathological fracture: M81.0

## 2015-02-02 HISTORY — DX: Other specified disorders of bone density and structure, unspecified site: M85.80

## 2015-04-10 ENCOUNTER — Other Ambulatory Visit: Payer: Self-pay | Admitting: Certified Nurse Midwife

## 2015-04-10 ENCOUNTER — Other Ambulatory Visit: Payer: BLUE CROSS/BLUE SHIELD

## 2015-04-10 DIAGNOSIS — Z0000 Encounter for general adult medical examination without abnormal findings: Secondary | ICD-10-CM

## 2015-04-10 LAB — COMPREHENSIVE METABOLIC PANEL
ALT: 16 U/L (ref 6–29)
AST: 21 U/L (ref 10–35)
Albumin: 4.4 g/dL (ref 3.6–5.1)
Alkaline Phosphatase: 62 U/L (ref 33–130)
BUN: 19 mg/dL (ref 7–25)
CHLORIDE: 101 mmol/L (ref 98–110)
CO2: 27 mmol/L (ref 20–31)
CREATININE: 0.79 mg/dL (ref 0.50–0.99)
Calcium: 9.6 mg/dL (ref 8.6–10.4)
GLUCOSE: 88 mg/dL (ref 65–99)
Potassium: 4.6 mmol/L (ref 3.5–5.3)
SODIUM: 138 mmol/L (ref 135–146)
Total Bilirubin: 0.7 mg/dL (ref 0.2–1.2)
Total Protein: 7.8 g/dL (ref 6.1–8.1)

## 2015-04-10 LAB — LIPID PANEL
Cholesterol: 187 mg/dL (ref 125–200)
HDL: 67 mg/dL (ref >=46)
LDL CALC: 102 mg/dL (ref <130)
TRIGLYCERIDES: 91 mg/dL (ref <150)
Total CHOL/HDL Ratio: 2.8 Ratio (ref <=5.0)
VLDL: 18 mg/dL (ref <30)

## 2015-04-10 LAB — CBC WITH DIFFERENTIAL/PLATELET
BASOS PCT: 1 % (ref 0–1)
Basophils Absolute: 0 10*3/uL (ref 0.0–0.1)
Eosinophils Absolute: 0.1 10*3/uL (ref 0.0–0.7)
Eosinophils Relative: 2 % (ref 0–5)
HCT: 45.3 % (ref 36.0–46.0)
HEMOGLOBIN: 15.8 g/dL — AB (ref 12.0–15.0)
LYMPHS ABS: 1.2 10*3/uL (ref 0.7–4.0)
Lymphocytes Relative: 24 % (ref 12–46)
MCH: 32.3 pg (ref 26.0–34.0)
MCHC: 34.9 g/dL (ref 30.0–36.0)
MCV: 92.6 fL (ref 78.0–100.0)
MPV: 9.7 fL (ref 8.6–12.4)
Monocytes Absolute: 0.4 10*3/uL (ref 0.1–1.0)
Monocytes Relative: 8 % (ref 3–12)
NEUTROS PCT: 65 % (ref 43–77)
Neutro Abs: 3.1 10*3/uL (ref 1.7–7.7)
Platelets: 197 10*3/uL (ref 150–400)
RBC: 4.89 MIL/uL (ref 3.87–5.11)
RDW: 13.2 % (ref 11.5–15.5)
WBC: 4.8 10*3/uL (ref 4.0–10.5)

## 2015-04-10 LAB — TSH: TSH: 2.24 mIU/L

## 2015-04-11 ENCOUNTER — Telehealth: Payer: Self-pay

## 2015-04-11 LAB — HEMOGLOBIN A1C
Hgb A1c MFr Bld: 5.6 % (ref <5.7)
MEAN PLASMA GLUCOSE: 114 mg/dL (ref <117)

## 2015-04-11 LAB — VITAMIN D 25 HYDROXY (VIT D DEFICIENCY, FRACTURES): VIT D 25 HYDROXY: 31 ng/mL (ref 30–100)

## 2015-04-11 NOTE — Telephone Encounter (Signed)
Patient notified of results. See lab 

## 2015-04-11 NOTE — Telephone Encounter (Signed)
-----   Message from Regina Eck, CNM sent at 04/11/2015  8:06 AM EST ----- Notify patient that Hgb A1-C is normal Vitamin D low normal, recommend daily OTC Vitamin D 3 1000 IU Lipid panel all normal, looks great Liver, kidney, glucose profile is normal TSH is normal CBC with Diff is essentially normal

## 2015-04-11 NOTE — Telephone Encounter (Signed)
No answer. try again. 

## 2015-04-14 ENCOUNTER — Encounter: Payer: Self-pay | Admitting: Nurse Practitioner

## 2015-04-14 ENCOUNTER — Ambulatory Visit (INDEPENDENT_AMBULATORY_CARE_PROVIDER_SITE_OTHER): Payer: BLUE CROSS/BLUE SHIELD | Admitting: Nurse Practitioner

## 2015-04-14 VITALS — BP 126/70 | HR 64 | Ht 62.0 in | Wt 127.0 lb

## 2015-04-14 DIAGNOSIS — Z01419 Encounter for gynecological examination (general) (routine) without abnormal findings: Secondary | ICD-10-CM

## 2015-04-14 DIAGNOSIS — Z0000 Encounter for general adult medical examination without abnormal findings: Secondary | ICD-10-CM

## 2015-04-14 DIAGNOSIS — E559 Vitamin D deficiency, unspecified: Secondary | ICD-10-CM | POA: Diagnosis not present

## 2015-04-14 LAB — POCT URINALYSIS DIPSTICK
BILIRUBIN UA: NEGATIVE
Blood, UA: NEGATIVE
GLUCOSE UA: NEGATIVE
KETONES UA: NEGATIVE
LEUKOCYTES UA: NEGATIVE
Nitrite, UA: NEGATIVE
PROTEIN UA: NEGATIVE
Urobilinogen, UA: NEGATIVE
pH, UA: 6.5

## 2015-04-14 MED ORDER — ESTRADIOL 0.1 MG/GM VA CREA: TOPICAL_CREAM | VAGINAL | Status: DC

## 2015-04-14 NOTE — Patient Instructions (Signed)

## 2015-04-14 NOTE — Progress Notes (Signed)
Patient ID: Laura Baker, female   DOB: August 30, 1955, 59 y.o.   MRN: [id number]  60 y.o. [id number] Married  Caucasian Fe here for annual exam.  No new health problems. She is having some flares of hemorrhoids from time to time.  Able to use compresses and OTC medication with help.  Patient's last menstrual period was 11/01/2008.          Sexually active: Yes.    The current method of family planning is status post hysterectomy.    Exercising: No.  The patient does not participate in regular exercise at present. Smoker:  no  Health Maintenance: Pap: 2010, Negative  MMG:06/20/14, Bi-Rads 1: Negative  Colonoscopy: 07/07/12, Tubular Adenoma, repeat in 5 years BMD: Never TDaP:2010 Shingles: Never Hep C and HIV: will add to previous labs done on 04/10/15 Labs: 04/10/15 in EPIC  Urine:  Negative   reports that she has never smoked. She has never used smokeless tobacco. She reports that she does not drink alcohol or use illicit drugs.  Past Medical History  Diagnosis Date  . Blood transfusion without reported diagnosis 2006  . Cataract     Past Surgical History  Procedure Laterality Date  . Robotic assisted laparoscopic sacrocolpopexy  06/2010    --[facility]  . Abdominal hysterectomy  11/2008  . Cyst removal neck  2002  . Cataract extraction Right 2008  . Cataract extraction Left 05/2014    Current Outpatient Prescriptions  Medication Sig Dispense Refill  . Cholecalciferol (VITAMIN D PO) Take by mouth.    . Cholecalciferol (VITAMIN D3) 2000 units capsule Take by mouth.    . cyclobenzaprine (FLEXERIL) 5 MG tablet Take 0.5-2 tablets (2.5-10 mg total) by mouth 3 (three) times daily as needed for muscle spasms. 30 tablet 1  . estradiol (ESTRACE VAGINAL) 0.1 MG/GM vaginal cream Place 1/2 gram per vagina at bedtime 2 - 3 times per week. 42.5 g 3  . meloxicam (MOBIC) 15 MG tablet Take 1 tablet (15 mg total) by mouth daily. 30 tablet 0  . Multiple Vitamins-Minerals (CENTRUM SILVER ADULT 50+  PO) Take 1 tablet by mouth daily.     No current facility-administered medications for this visit.    Family History  Problem Relation Age of Onset  . Hypertension Mother   . Congestive Heart Failure Mother   . Thyroid disease Mother   . Osteoporosis Mother   . Stroke Mother   . Diabetes Mother   . Lung cancer Father   . Diabetes Sister   . Thyroid disease Sister   . Osteoporosis Sister   . Thyroid disease Brother   . Leukemia Sister   . Thyroid disease Sister   . Diabetes Sister   . Kidney disease Sister   . Scleroderma Sister   . Thyroid disease Brother   . Rashes / Skin problems Other     ROS:  Pertinent items are noted in HPI.  Otherwise, a comprehensive ROS was negative.  Exam:   BP 126/70 mmHg  Pulse 64  Ht 5\' 2"  (1.575 m)  Wt 127 lb (57.607 kg)  BMI 23.22 kg/m2  LMP 11/01/2008 Height: 5\' 2"  (157.5 cm)  Waist 30 inches. Ht Readings from Last 3 Encounters:  04/14/15 5\' 2"  (1.575 m)  11/30/14 5' 2.75" (1.594 m)  04/12/14 5\' 2"  (1.575 m)    General appearance: alert, cooperative and appears stated age Head: Normocephalic, without obvious abnormality, atraumatic Neck: no adenopathy, supple, symmetrical, trachea midline and thyroid normal to inspection and palpation Lungs:  clear to auscultation bilaterally Breasts: normal appearance, no masses or tenderness Heart: regular rate and rhythm Abdomen: soft, non-tender; no masses,  no organomegaly Extremities: extremities normal, atraumatic, no cyanosis or edema Skin: Skin color, texture, turgor normal. No rashes or lesions Lymph nodes: Cervical, supraclavicular, and axillary nodes normal. No abnormal inguinal nodes palpated Neurologic: Grossly normal   Pelvic: External genitalia:  no lesions              Urethra:  normal appearing urethra with no masses, tenderness or lesions              Bartholin's and Skene's: normal                 Vagina: normal appearing vagina with normal color and discharge, no lesions               Cervix: absent              Pap taken: No. Bimanual Exam:  Uterus:  uterus absent              Adnexa: no mass, fullness, tenderness               Rectovaginal: declines secondary to hemorrhoids               Anus:  declines  Chaperone present: no  A:  Well Woman with normal exam  Status post TAH. 11/2008 Ovaries remain.   Sacrocolpopexy 06/2010 with vaginal mesh erosion.   Borderline low vit D.   Atrophic vaginitis - better on vaginal estrogen   P:   Reviewed health and wellness pertinent to exam  Pap smear as above  Mammogram is due 06/2015 and will add BMD at the same time  Refill on Estrace vaginal cream at 2-3 times per week.  Counseled with risk of DVT, CVA, cancer, etc  Generic form is completed for work that included VS and waist measurement  Counseled on breast self exam, mammography screening, use and side effects of HRT, adequate intake of calcium and vitamin D, diet and exercise return annually or prn  An After Visit Summary was printed and given to the patient.

## 2015-04-15 LAB — HEPATITIS C ANTIBODY: HCV Ab: NEGATIVE

## 2015-04-15 LAB — HIV ANTIBODY (ROUTINE TESTING W REFLEX): HIV 1&2 Ab, 4th Generation: NONREACTIVE

## 2015-04-15 NOTE — Progress Notes (Signed)
Encounter reviewed by Dr. Brook Amundson C. Silva.  

## 2015-05-01 ENCOUNTER — Telehealth: Payer: Self-pay | Admitting: Nurse Practitioner

## 2015-05-01 NOTE — Telephone Encounter (Signed)
Patient needs to have an bone density order sent to [facility]. [place] NUMBER [phone]. Appointment 05/14/15

## 2015-05-01 NOTE — Telephone Encounter (Signed)
Call to patient. Verified she is scheduled at [place] on 05/14/2015. Order for BMD to Melvia Heaps CNM as Kem Boroughs, FNP is out of the office this week for signature before fax to [facility].

## 2015-05-03 DIAGNOSIS — S92909A Unspecified fracture of unspecified foot, initial encounter for closed fracture: Secondary | ICD-10-CM

## 2015-05-03 HISTORY — DX: Unspecified fracture of unspecified foot, initial encounter for closed fracture: S92.909A

## 2015-05-13 NOTE — Telephone Encounter (Signed)
Order faxed. Encounter closed. Order is scanned.

## 2015-05-16 ENCOUNTER — Ambulatory Visit (INDEPENDENT_AMBULATORY_CARE_PROVIDER_SITE_OTHER): Payer: BLUE CROSS/BLUE SHIELD

## 2015-05-16 ENCOUNTER — Ambulatory Visit (INDEPENDENT_AMBULATORY_CARE_PROVIDER_SITE_OTHER): Payer: BLUE CROSS/BLUE SHIELD | Admitting: Family Medicine

## 2015-05-16 VITALS — BP 118/62 | HR 104 | Temp 98.1°F | Resp 16 | Ht 62.0 in | Wt 129.0 lb

## 2015-05-16 DIAGNOSIS — M79672 Pain in left foot: Secondary | ICD-10-CM

## 2015-05-16 NOTE — Patient Instructions (Signed)
IF you received an x-ray today, you will receive an invoice from [facility]. Please contact [facility] at [phone] with questions or concerns regarding your invoice.   IF you received labwork today, you will receive an invoice from [employer]. Please contact [facility] at [phone] with questions or concerns regarding your invoice.   Our billing staff will not be able to assist you with questions regarding bills from these companies.  You will be contacted with the lab results as soon as they are available. The fastest way to get your results is to activate your My Chart account. Instructions are located on the last page of this paperwork. If you have not heard from [place] regarding the results in 2 weeks, please contact this office.    The radiologist did not see a fracture in your foot. You can try the postop shoe for next 1-2 weeks if needed, but if pain not resolving at that time - return for repeat xray.  Return to the clinic or go to the nearest emergency room if any of your symptoms worsen or new symptoms occur.  Foot Sprain A foot sprain is an injury to one of the strong bands of tissue (ligaments) that connect and support the many bones in your feet. The ligament can be stretched too much or it can tear. A tear can be either partial or complete. The severity of the sprain depends on how much of the ligament was damaged or torn. CAUSES A foot sprain is usually caused by suddenly twisting or pivoting your foot. RISK FACTORS This injury is more likely to occur in people who:  Play a sport, such as basketball or football.  Exercise or play a sport without warming up.  Start a new workout or sport.  Suddenly increase how long or hard they exercise or play a sport. SYMPTOMS Symptoms of this condition start soon after an injury and include:  Pain, especially in the arch of the foot.  Bruising.  Swelling.  Inability to walk or  use the foot to support body weight. DIAGNOSIS This condition is diagnosed with a medical history and physical exam. You may also have imaging tests, such as:  X-rays to make sure there are no broken bones (fractures).  MRI to see if the ligament has torn. TREATMENT Treatment varies depending on the severity of your sprain. Mild sprains can be treated with rest, ice, compression, and elevation (RICE). If your ligament is overstretched or partially torn, treatment usually involves keeping your foot in a fixed position (immobilization) for a period of time. To help you do this, your health care provider will apply a bandage, splint, or walking boot to keep your foot from moving until it heals. You may also be advised to use crutches or a scooter for a few weeks to avoid bearing weight on your foot while it is healing. If your ligament is fully torn, you may need surgery to reconnect the ligament to the bone. After surgery, a cast or splint will be applied and will need to stay on your foot while it heals. Your health care provider may also suggest exercises or physical therapy to strengthen your foot. HOME CARE INSTRUCTIONS If You Have a Bandage, Splint, or Walking Boot:  Wear it as directed by your health care provider. Remove it only as directed by your health care provider.  Loosen the bandage, splint, or walking boot if your toes become numb and tingle, or if they turn cold and  blue. Bathing  If your health care provider approves bathing and showering, cover the bandage or splint with a watertight plastic bag to protect it from water. Do not let the bandage or splint get wet. Managing Pain, Stiffness, and Swelling   If directed, apply ice to the injured area:  Put ice in a plastic bag.  Place a towel between your skin and the bag.  Leave the ice on for 20 minutes, 2-3 times per day.  Move your toes often to avoid stiffness and to lessen swelling.  Raise (elevate) the injured area  above the level of your heart while you are sitting or lying down. Driving  Do not drive or operate heavy machinery while taking pain medicine.  Do not drive while wearing a bandage, splint, or walking boot on a foot that you use for driving. Activity  Rest as directed by your health care provider.  Do not use the injured foot to support your body weight until your health care provider says that you can. Use crutches or other supportive devices as directed by your health care provider.  Ask your health care provider what activities are safe for you. Gradually increase how much and how far you walk until your health care provider says it is safe to return to full activity.  Do any exercise or physical therapy as directed by your health care provider. General Instructions  If a splint was applied, do not put pressure on any part of it until it is fully hardened. This may take several hours.  Take medicines only as directed by your health care provider. These include over-the-counter medicines and prescription medicines.  Keep all follow-up visits as directed by your health care provider. This is important.  When you can walk without pain, wear supportive shoes that have stiff soles. Do not wear flip-flops, and do not walk barefoot. SEEK MEDICAL CARE IF:  Your pain is not controlled with medicine.  Your bruising or swelling gets worse or does not get better with treatment.  Your splint or walking boot is damaged. SEEK IMMEDIATE MEDICAL CARE IF:  Your foot is numb or blue.  Your foot feels colder than normal.   This information is not intended to replace advice given to you by your health care provider. Make sure you discuss any questions you have with your health care provider.   Document Released: 07/10/2001 Document Revised: 06/04/2014 Document Reviewed: 11/21/2013 Elsevier Interactive Patient Education [employer].

## 2015-05-16 NOTE — Progress Notes (Signed)
Subjective:  By signing my name below, I, Raven Small, attest that this documentation has been prepared under the direction and in the presence of Merri Ray, MD.  Electronically Signed: Thea Alken, ED Scribe. 05/12/2015. 2:01 PM.   Patient ID: Laura Baker, female    DOB: [birth year], 60 y.o.   MRN: [id number]  HPI Chief Complaint  Patient presents with  . Foot Injury    left foot pain, tripped on sidewalk 9 days ago   HPI Comments: Laura Baker is a 60 y.o. female who presents to the [facility] complaining of left foot injury that occurred 9 days ago Pt states she hyperextended her foot when stepping off of a sidewalk. Pt denies having pain initially but began to have pain to the top of her foot 3 days ago.   She reports worsening pain with walking and bearing weight. She has been taking tylenol as well as icing foot with minimal relief.  Patient Active Problem List   Diagnosis Date Noted  . Vaginal erosion due to surgical mesh 04/09/2013   Past Medical History  Diagnosis Date  . Blood transfusion without reported diagnosis 2006  . Cataract    Past Surgical History  Procedure Laterality Date  . Robotic assisted laparoscopic sacrocolpopexy  06/2010    --[place]  . Abdominal hysterectomy  11/2008  . Cyst removal neck  2002  . Cataract extraction Right 2008  . Cataract extraction Left 05/2014   Allergies  Allergen Reactions  . Sulfa Antibiotics Hives   Prior to Admission medications   Medication Sig Start Date End Date Taking? Authorizing Provider  Cholecalciferol (VITAMIN D PO) Take by mouth.   Yes Historical Provider, MD  Cholecalciferol (VITAMIN D3) 2000 units capsule Take by mouth.   Yes Historical Provider, MD  estradiol (ESTRACE VAGINAL) 0.1 MG/GM vaginal cream Place 1/2 gram per vagina at bedtime 2 - 3 times per week. 04/14/15  Yes Kem Boroughs, FNP  Multiple Vitamins-Minerals (CENTRUM SILVER ADULT 50+ PO) Take 1 tablet by mouth  daily.   Yes Historical Provider, MD   Social History   Social History  . Marital Status: Married    Spouse Name: N/A  . Number of Children: N/A  . Years of Education: N/A   Occupational History  . Not on file.   Social History Main Topics  . Smoking status: Never Smoker   . Smokeless tobacco: Never Used  . Alcohol Use: No  . Drug Use: No  . Sexual Activity:    Partners: Male    Birth Control/ Protection: Surgical     Comment: R-TLH--still has ovaries   Other Topics Concern  . Not on file   Social History Narrative   Review of Systems  Musculoskeletal: Positive for myalgias, arthralgias and gait problem.  Skin: Negative for wound.  Neurological: Negative for weakness and numbness.     Objective:   Physical Exam  Constitutional: She is oriented to person, place, and time. She appears well-developed and well-nourished. No distress.  HENT:  Head: Normocephalic and atraumatic.  Eyes: Conjunctivae and EOM are normal.  Neck: Neck supple.  Cardiovascular: Normal rate.   Pulmonary/Chest: Effort normal.  Musculoskeletal: Normal range of motion.  Calf non tender.Ankle non tender. malleoli are non tender at ankle. 5th MT and navicula are non tender. Skin intact no bruising. NIV distally. Great toe non tender. Toes are non tender . Some slight tenderness soft tissue swelling to distal 2nd, 3rd and 4th MT dorsally  only. Able to flex and extend toes without. Possible faint ecchymosis on the dorsum of the foot.   Neurological: She is alert and oriented to person, place, and time.  Skin: Skin is warm and dry.  Psychiatric: She has a normal mood and affect. Her behavior is normal.  Nursing note and vitals reviewed.   Filed Vitals:   05/16/15 1334  BP: 118/62  Pulse: 104  Temp: 98.1 F (36.7 C)  TempSrc: Oral  Resp: 16  Height: 5\' 2"  (1.575 m)  Weight: 129 lb (58.514 kg)  SpO2: 96%    Dg Foot Complete Left  05/16/2015  CLINICAL DATA:  Patient with mid foot pain after  hyperextension injury of the toes. Initial encounter. EXAM: LEFT FOOT - COMPLETE 3+ VIEW COMPARISON:  None. FINDINGS: Hallux valgus deformity. No evidence for acute fracture or dislocation. The tuft of the great toe distally is diminutive. Regional soft tissues are unremarkable. IMPRESSION: No acute osseous abnormality. Electronically Signed   By: Lovey Newcomer M.D.   On: 05/16/2015 14:22    Assessment & Plan:   Laura Baker is a 60 y.o. female Left foot pain - Plan: DG Foot Complete Left  -no apparent fracture, but occult fx or sprain at forefoot possible.   -post op shoe, recheck if pain persists in 1-2 weeks.   No orders of the defined types were placed in this encounter.   Patient Instructions     IF you received an x-ray today, you will receive an invoice from [facility]. Please contact [facility] at [phone] with questions or concerns regarding your invoice.   IF you received labwork today, you will receive an invoice from [employer]. Please contact [facility] at [phone] with questions or concerns regarding your invoice.   Our billing staff will not be able to assist you with questions regarding bills from these companies.  You will be contacted with the lab results as soon as they are available. The fastest way to get your results is to activate your My Chart account. Instructions are located on the last page of this paperwork. If you have not heard from [place] regarding the results in 2 weeks, please contact this office.    The radiologist did not see a fracture in your foot. You can try the postop shoe for next 1-2 weeks if needed, but if pain not resolving at that time - return for repeat xray.  Return to the clinic or go to the nearest emergency room if any of your symptoms worsen or new symptoms occur.  Foot Sprain A foot sprain is an injury to one of the strong bands of tissue (ligaments) that connect and support the many  bones in your feet. The ligament can be stretched too much or it can tear. A tear can be either partial or complete. The severity of the sprain depends on how much of the ligament was damaged or torn. CAUSES A foot sprain is usually caused by suddenly twisting or pivoting your foot. RISK FACTORS This injury is more likely to occur in people who:  Play a sport, such as basketball or football.  Exercise or play a sport without warming up.  Start a new workout or sport.  Suddenly increase how long or hard they exercise or play a sport. SYMPTOMS Symptoms of this condition start soon after an injury and include:  Pain, especially in the arch of the foot.  Bruising.  Swelling.  Inability to walk or use the foot  to support body weight. DIAGNOSIS This condition is diagnosed with a medical history and physical exam. You may also have imaging tests, such as:  X-rays to make sure there are no broken bones (fractures).  MRI to see if the ligament has torn. TREATMENT Treatment varies depending on the severity of your sprain. Mild sprains can be treated with rest, ice, compression, and elevation (RICE). If your ligament is overstretched or partially torn, treatment usually involves keeping your foot in a fixed position (immobilization) for a period of time. To help you do this, your health care provider will apply a bandage, splint, or walking boot to keep your foot from moving until it heals. You may also be advised to use crutches or a scooter for a few weeks to avoid bearing weight on your foot while it is healing. If your ligament is fully torn, you may need surgery to reconnect the ligament to the bone. After surgery, a cast or splint will be applied and will need to stay on your foot while it heals. Your health care provider may also suggest exercises or physical therapy to strengthen your foot. HOME CARE INSTRUCTIONS If You Have a Bandage, Splint, or Walking Boot:  Wear it as directed by  your health care provider. Remove it only as directed by your health care provider.  Loosen the bandage, splint, or walking boot if your toes become numb and tingle, or if they turn cold and blue. Bathing  If your health care provider approves bathing and showering, cover the bandage or splint with a watertight plastic bag to protect it from water. Do not let the bandage or splint get wet. Managing Pain, Stiffness, and Swelling   If directed, apply ice to the injured area:  Put ice in a plastic bag.  Place a towel between your skin and the bag.  Leave the ice on for 20 minutes, 2-3 times per day.  Move your toes often to avoid stiffness and to lessen swelling.  Raise (elevate) the injured area above the level of your heart while you are sitting or lying down. Driving  Do not drive or operate heavy machinery while taking pain medicine.  Do not drive while wearing a bandage, splint, or walking boot on a foot that you use for driving. Activity  Rest as directed by your health care provider.  Do not use the injured foot to support your body weight until your health care provider says that you can. Use crutches or other supportive devices as directed by your health care provider.  Ask your health care provider what activities are safe for you. Gradually increase how much and how far you walk until your health care provider says it is safe to return to full activity.  Do any exercise or physical therapy as directed by your health care provider. General Instructions  If a splint was applied, do not put pressure on any part of it until it is fully hardened. This may take several hours.  Take medicines only as directed by your health care provider. These include over-the-counter medicines and prescription medicines.  Keep all follow-up visits as directed by your health care provider. This is important.  When you can walk without pain, wear supportive shoes that have stiff soles. Do  not wear flip-flops, and do not walk barefoot. SEEK MEDICAL CARE IF:  Your pain is not controlled with medicine.  Your bruising or swelling gets worse or does not get better with treatment.  Your splint or walking  boot is damaged. SEEK IMMEDIATE MEDICAL CARE IF:  Your foot is numb or blue.  Your foot feels colder than normal.   This information is not intended to replace advice given to you by your health care provider. Make sure you discuss any questions you have with your health care provider.   Document Released: 07/10/2001 Document Revised: 06/04/2014 Document Reviewed: 11/21/2013 Elsevier Interactive Patient Education [employer].      I personally performed the services described in this documentation, which was scribed in my presence. The recorded information has been reviewed and considered, and addended by me as needed.

## 2015-05-21 ENCOUNTER — Ambulatory Visit (INDEPENDENT_AMBULATORY_CARE_PROVIDER_SITE_OTHER): Payer: BLUE CROSS/BLUE SHIELD | Admitting: Family Medicine

## 2015-05-21 ENCOUNTER — Encounter: Payer: Self-pay | Admitting: Family Medicine

## 2015-05-21 VITALS — BP 120/80 | HR 86 | Wt 129.0 lb

## 2015-05-21 DIAGNOSIS — S99922D Unspecified injury of left foot, subsequent encounter: Secondary | ICD-10-CM | POA: Diagnosis not present

## 2015-05-21 DIAGNOSIS — S99922A Unspecified injury of left foot, initial encounter: Secondary | ICD-10-CM | POA: Insufficient documentation

## 2015-05-21 MED ORDER — HYDROCODONE-ACETAMINOPHEN 5-325 MG PO TABS: 1.0000 | ORAL_TABLET | Freq: Four times a day (QID) | ORAL | Status: DC | PRN

## 2015-05-21 NOTE — Patient Instructions (Signed)
Continue resting, icing, elevating and you may try an ACE wrap for comfort since the post-op shoe is not helping. You will receive a phone call from the orthopedist office.

## 2015-05-21 NOTE — Progress Notes (Signed)
   Subjective:    Patient ID: Laura Baker, female    DOB: January 28, 1956, 60 y.o.   MRN: [id number]  HPI Chief Complaint  Patient presents with  . lt foot pain    still swollen and is in pain. said they gave her a boot but it is hurting her even worse it is hard to wear.    She is here with complaints of left foot pain since injuring it 2 weeks ago stepping off a curb. She has been seen and treated at Urgent care. Negative XR of foot on 05/16/2015. She was diagnosed with forefoot sprain on that date and given a postop boot to wear. She states the boot actually makes her foot hurt worse and has not been wearing it for past 2 days. Pain is dorsal forefoot area and non radiating. Pain is worse with weight bearing and walking and somewhat improved with rest. She has been taking Tylenol for pain but states it has not been helping. States she did not go back to [facility] today due to she did not want to have to wait so she scheduled an appointment here.  She has not been seen here since 2011. She has been seeing her Editor, commissioning.   States she had a bone density test done on 05/14/15 but does not have the result yet.      Review of Systems Pertinent positives and negatives in the history of present illness.     Objective:   Physical Exam BP 120/80 mmHg  Pulse 86  Wt 129 lb (58.514 kg)  LMP 11/01/2008  Left calf and ankle exams are normal. Malleoli are nontender.  Left dorsal forefoot with mild edema and marked tenderness, normal capillary refill, sensation and pulse, no erythema, skin is intact without bruising, toes are nontender, normal ROM with flexion and extension to toes.       Assessment & Plan:  Injury of left foot including toes, subsequent encounter  Discussed that her foot XR was negative on 05/16/2015 and that the provider at [facility] suspected that she had a sprain of her forefoot. Since the postop shoe is causing more pain that comfort, recommend that she not wear it and try an ace  wrap for support. Also discussed RICE and since Tylenol is not helping with pain control, I will prescribe pain medication to use as needed for severe pain. norco prescription given to patient with instructions to avoid driving or drinking alcohol with it. Referral to orthopedist in chart. Gabriel Cirri, CMA, will work on scheduling her appointment.

## 2015-05-23 ENCOUNTER — Telehealth: Payer: Self-pay | Admitting: Nurse Practitioner

## 2015-05-23 NOTE — Telephone Encounter (Addendum)
Please let patient know that BMD done on 05/14/15 shows a T score at the spine: -1.80; right hip neck -2.4; left hip neck -2.2.  There are no previous comparisons.  She falls into the lower Osteopenic range for both hips with a FRAX score for hip at 1.9% (goal is <3%) and FRAX score for major is at 20% (goal is < 20%)..  She does have [place] of osteoporosis.  She needs to come in and have a visit with MD to discuss other test that may need to be done.  Please schedule apt.  She is non smoker or use of ETOH, [place] + Osteoporosis in mother and sister, borderline low Vit D.

## 2015-05-26 NOTE — Telephone Encounter (Signed)
Message left to return call to Layah Skousen at [phone].    

## 2015-05-27 NOTE — Telephone Encounter (Signed)
Spoke with patient. Advised of results as seen below from Kem Boroughs, [place]. She is agreeable and verbalizes understanding. Consult appointment scheduled on 06/04/2015 at 10:30 am with Dr.Silva. She is agreeable to date and time.  Cc: Dr.Silva  Routing to provider for final review. Patient agreeable to disposition. Will close encounter.

## 2015-05-27 NOTE — Telephone Encounter (Signed)
Return call to Tracy. °

## 2015-05-28 ENCOUNTER — Telehealth: Payer: Self-pay | Admitting: Family Medicine

## 2015-05-28 NOTE — Telephone Encounter (Signed)
Pt called about xray she had done because Ortho. Wants her to bring them, advised her that we do not have them here she would need to call the facility where the xray was done

## 2015-06-04 ENCOUNTER — Ambulatory Visit (INDEPENDENT_AMBULATORY_CARE_PROVIDER_SITE_OTHER): Payer: BLUE CROSS/BLUE SHIELD | Admitting: Obstetrics and Gynecology

## 2015-06-04 ENCOUNTER — Encounter: Payer: Self-pay | Admitting: Obstetrics and Gynecology

## 2015-06-04 VITALS — BP 112/70 | HR 60 | Ht 62.0 in | Wt 132.8 lb

## 2015-06-04 DIAGNOSIS — M8580 Other specified disorders of bone density and structure, unspecified site: Secondary | ICD-10-CM

## 2015-06-04 MED ORDER — RISEDRONATE SODIUM 150 MG PO TABS: 150.0000 mg | ORAL_TABLET | ORAL | Status: DC

## 2015-06-04 NOTE — Progress Notes (Signed)
Patient ID: Laura Baker, female   DOB: 11-26-1955, 60 y.o.   MRN: [id number] GYNECOLOGY  VISIT   HPI: 60 y.o.   Married  Caucasian  female   [phone] with Patient's last menstrual period was 11/01/2008.   here for consultation regarding bone density results.   BMD [facility] -  T score R hip -.24 T score L hip -2.2 T score spine -1.8  Just broke her foot. Stepped off a curb wrong.  Wearing a boot.  Has expectation for 6 - 8 weeks of healing.  Sees Dr. Melanee Spry for orthopedic care.  Has an appointment on 06/04/15.  Denies hx of tobacco use, ETOH use, steroid use, malabsorptive syndrome.  Believes mother had osteopenia or osteoporosis.   Takes MVI and vit D daily.  Has 2 servings of calcium daily.   GYNECOLOGIC HISTORY: Patient's last menstrual period was 11/01/2008. Contraception:  Hysterectomy Menopausal hormone therapy:  Estrace vaginal cream 2-3x/week. Last mammogram:  06-20-14 Density C/Neg/BiRads1:[facility] Last pap smear:   2010 Neg        OB History    Gravida Para Term Preterm AB TAB SAB Ectopic Multiple Living   3 3 3  0 0 0 0 0 0 3         Patient Active Problem List   Diagnosis Date Noted  . Injury of left foot including toes 05/21/2015  . 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 Blood transfusion without reported diagnosis 2006  . Cataract   . Broken foot 05/2015    -right foot    Past Surgical History  Procedure Laterality Date  . Robotic assisted laparoscopic sacrocolpopexy  06/2010    --[facility]  . Abdominal hysterectomy  11/2008  . Cyst removal neck  2002  . Cataract extraction Right 2008  . Cataract extraction Left 05/2014  . Abdominal sacrocolpopexy      Current Outpatient Prescriptions  Medication Sig Dispense Refill  . Cholecalciferol (VITAMIN D PO) Take by mouth.    . Cholecalciferol (VITAMIN D3) 2000 units capsule Take by mouth.    . estradiol (ESTRACE VAGINAL) 0.1 MG/GM vaginal cream Place  1/2 gram per vagina at bedtime 2 - 3 times per week. 42.5 g 3  . HYDROcodone-acetaminophen (NORCO) 5-325 MG tablet Take 1 tablet by mouth every 6 (six) hours as needed for moderate pain. 10 tablet 0  . Multiple Vitamins-Minerals (CENTRUM SILVER ADULT 50+ PO) Take 1 tablet by mouth daily.     No current facility-administered medications for this visit.     ALLERGIES: Sulfa antibiotics  Family History  Problem Relation Age of Onset  . Hypertension Mother   . Congestive Heart Failure Mother   . Thyroid disease Mother   . Osteoporosis Mother   . Stroke Mother   . Diabetes Mother   . Lung cancer Father   . Diabetes Sister   . Thyroid disease Sister   . Osteoporosis Sister   . Thyroid disease Brother   . Leukemia Sister   . Thyroid disease Sister   . Diabetes Sister   . Kidney disease Sister   . Scleroderma Sister   . Thyroid disease Brother   . Rashes / Skin problems Other     Social History   Social History  . Marital Status: Married    Spouse Name: N/A  . Number of Children: N/A  . Years of Education: N/A   Occupational History  . Not on file.  Social History Main Topics  . Smoking status: Never Smoker   . Smokeless tobacco: Never Used  . Alcohol Use: No  . Drug Use: No  . Sexual Activity:    Partners: Male    Birth Control/ Protection: Surgical     Comment: R-TLH--still has ovaries   Other Topics Concern  . Not on file   Social History Narrative    ROS:  Pertinent items are noted in HPI.  PHYSICAL EXAMINATION:    BP 112/70 mmHg  Pulse 60  Ht 5\' 2"  (1.575 m)  Wt 132 lb 12.8 oz (60.238 kg)  BMI 24.28 kg/m2  LMP 11/01/2008    General appearance: alert, cooperative and appears stated age  ASSESSMENT  Osteopenia.  Recent foot fracture. On ERT.   PLAN  Discussion of osteopenia.  Discussed fall prevention.  Discussed medical therapy - bisphosfonates, Evista.  Will start Actonel monthly after seeing her Orthopedist.  Discussed side effects.   Continue calcium and vit D. Next BMD in 2 years.    An After Visit Summary was printed and given to the patient.  _25_____ minutes face to face time of which over 50% was spent in counseling.

## 2015-06-04 NOTE — Patient Instructions (Signed)
Risedronate tablets What is this medicine? RISEDRONATE (ris ED roe nate) reduces calcium loss from bones. It helps make healthy bone and to slow bone loss in patients with Paget's disease and osteoporosis. It may be used in others at risk for bone loss. This medicine may be used for other purposes; ask your health care provider or pharmacist if you have questions. What should I tell my health care provider before I take this medicine? They need to know if you have any of these conditions: -dental disease -esophagus, stomach, or intestine problems, like acid reflux or GERD -kidney disease -low blood calcium -problems sitting or standing for 30 minutes -trouble swallowing -an unusual or allergic reaction to risedronate, other medicines, foods, dyes, or preservatives -pregnant or trying to get pregnant -breast-feeding How should I use this medicine? You must take this medication exactly as directed or you will lower the amount of medicine you absorb into your body or you may cause your self harm. Take this medicine by mouth first thing in the morning, after you are up for the day. Do not eat or drink anything before you take this medicine. Swallow the tablets with a full glass (6 to 8 fluid ounces) of plain water. Do not take the tablets with any other drink. Do not chew or crush the tablet. After taking this medicine, do not eat breakfast, drink, or take any other medicines or vitamins for at least 30 minutes. Stand or sit up for at least 30 minutes after you take this medicine; do not lie down. Do not take your medicine more often than directed. Talk to your pediatrician regarding the use of this medicine in children. Special care may be needed. Overdosage: If you think you have taken too much of this medicine contact a poison control center or emergency room at once. NOTE: This medicine is only for you. Do not share this medicine with others. What if I miss a dose? If you miss a dose, do not  take it later in the day. Take your normal dose the next morning. Do not take double or extra doses. What may interact with this medicine? -antacids like aluminum hydroxide or magnesium hydroxide -aspirin -calcium supplements -iron supplements -NSAIDs, medicines for pain and inflammation, like ibuprofen or naproxen -thyroid hormones -vitamins with minerals This list may not describe all possible interactions. Give your health care provider a list of all the medicines, herbs, non-prescription drugs, or dietary supplements you use. Also tell them if you smoke, drink alcohol, or use illegal drugs. Some items may interact with your medicine. What should I watch for while using this medicine? Visit your doctor or health care professional for regular check ups. It may be some time before you see the benefit from this medicine. Your doctor or health care professional may order blood tests and other tests to see how you are doing. You should make sure you get enough calcium and vitamin D while you are taking this medicine, unless your doctor tells you not to. Discuss the foods you eat and the vitamins you take with your health care professional. Some people who take this medicine have severe bone, joint, and/or muscle pain. This medicine may also increase your risk for a broken thigh bone. Tell your doctor right away if you have pain in your upper leg or groin. Tell your doctor if you have any pain that does not go away or that gets worse. What side effects may I notice from receiving this medicine? Side effects  that you should report to your doctor as soon as possible: -allergic reactions such as skin rash or itching, hives, swelling of the face, lips, throat, or tongue -black or tarry stools -changes in vision -heartburn or stomach pain -jaw pain, especially after dental work -pain or difficulty when swallowing -redness, blistering, peeling, or loosening of the skin, including inside the mouth Side  effects that usually do not require medical attention (report to your doctor if they continue or are bothersome): -bone, muscle, or joint pain -changes in taste -diarrhea or constipation -eye pain or itching -headache -nausea or vomiting -stomach gas or fullness This list may not describe all possible side effects. Call your doctor for medical advice about side effects. You may report side effects to FDA at 1-800-FDA-1088. Where should I keep my medicine? Keep out of the reach of children. Store at room temperature between 20 and 25 degrees C (68 and 77 degrees F). Throw away any unused medicine after the expiration date. NOTE: This sheet is a summary. It may not cover all possible information. If you have questions about this medicine, talk to your doctor, pharmacist, or health care provider.    2016, Elsevier/Gold Standard. (2011-12-10 16:21:37)

## 2015-06-25 ENCOUNTER — Encounter: Payer: Self-pay | Admitting: Family Medicine

## 2015-06-25 LAB — HM MAMMOGRAPHY

## 2015-09-12 ENCOUNTER — Ambulatory Visit (INDEPENDENT_AMBULATORY_CARE_PROVIDER_SITE_OTHER): Payer: BLUE CROSS/BLUE SHIELD | Admitting: Obstetrics and Gynecology

## 2015-09-12 ENCOUNTER — Encounter: Payer: Self-pay | Admitting: Obstetrics and Gynecology

## 2015-09-12 VITALS — BP 102/60 | HR 72 | Resp 16 | Ht 62.0 in | Wt 134.0 lb

## 2015-09-12 DIAGNOSIS — M8580 Other specified disorders of bone density and structure, unspecified site: Secondary | ICD-10-CM | POA: Diagnosis not present

## 2015-09-12 NOTE — Progress Notes (Signed)
GYNECOLOGY  VISIT   HPI: 60 y.o.   Married  Caucasian  female   [phone] with Patient's last menstrual period was 11/01/2008.   here for   18month follow-up after starting Actonel  Patient had bone pain, headache, decreased activity, and groggy after taking one dose of the Actonel.  Asking if she can take OTC medication instead. Her sister takes Caltrate. Patient currently taking vit D 2000 IU daily and consumes dairy three times per day.   Hx osteopenia, foot fracture.   Has started walking again now that fx has healed.  Vit D 31 on 04/10/15.  GYNECOLOGIC HISTORY: Patient's last menstrual period was 11/01/2008. Contraception:  hysterectomy Menopausal hormone therapy:  Estradiol cream. Last mammogram:  06/25/15 BIRADS1 negative Last pap smear:   2010 negative        OB History    Gravida Para Term Preterm AB Living   3 3 3  0 0 3   SAB TAB Ectopic Multiple Live Births   0 0 0 0 3         Patient Active Problem List   Diagnosis Date Noted  . Injury of left foot including toes 05/21/2015  . 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 Blood transfusion without reported diagnosis 2006  . Broken foot 05/2015   -right foot  . Cataract   . Osteopenia 2017   bone fracture April 2017.  Did not tolerate Actonel.  Do bone density in 2019.    Past Surgical History:  Procedure Laterality Date  . ABDOMINAL HYSTERECTOMY  11/2008  . ABDOMINAL SACROCOLPOPEXY    . CATARACT EXTRACTION Right 2008  . CATARACT EXTRACTION Left 05/2014  . CYST REMOVAL NECK  2002  . ROBOTIC ASSISTED LAPAROSCOPIC SACROCOLPOPEXY  06/2010   --L.Y[facility]onda [facility]    Current Outpatient Prescriptions  Medication Sig Dispense Refill  . Cholecalciferol (VITAMIN D PO) Take by mouth.    . Cholecalciferol (VITAMIN D3) 2000 units capsule Take by mouth.    . estradiol (ESTRACE VAGINAL) 0.1 MG/GM vaginal cream Place 1/2 gram per vagina at bedtime 2 - 3 times per week. 42.5 g 3   . Multiple Vitamins-Minerals (CENTRUM SILVER ADULT 50+ PO) Take 1 tablet by mouth daily.    . risedronate (ACTONEL) 150 MG tablet Take 1 tablet (150 mg total) by mouth every 30 (thirty) days. with water on empty stomach, nothing by mouth or lie down for next 30 minutes. 1 tablet 11   No current facility-administered medications for this visit.      ALLERGIES: Sulfa antibiotics  Family History  Problem Relation Age of Onset  . Hypertension Mother   . Congestive Heart Failure Mother   . Thyroid disease Mother   . Osteoporosis Mother   . Stroke Mother   . Diabetes Mother   . Lung cancer Father   . Diabetes Sister   . Thyroid disease Sister   . Osteoporosis Sister   . Thyroid disease Brother   . Leukemia Sister   . Thyroid disease Sister   . Diabetes Sister   . Kidney disease Sister   . Scleroderma Sister   . Thyroid disease Brother   . Rashes / Skin problems Other     Social History   Social History  . Marital status: Married    Spouse name: N/A  . Number of children: N/A  . Years of education: N/A   Occupational History  . Not on file.  Social History Main Topics  . Smoking status: Never Smoker  . Smokeless tobacco: Never Used  . Alcohol use No  . Drug use: No  . Sexual activity: Yes    Partners: Male    Birth control/ protection: Surgical     Comment: R-TLH--still has ovaries   Other Topics Concern  . Not on file   Social History Narrative  . No narrative on file    ROS:  Pertinent items are noted in HPI.  PHYSICAL EXAMINATION:    BP 102/60 (BP Location: Right Arm, Patient Position: Sitting, Cuff Size: Normal)   Pulse 72   Resp 16   Ht 5\' 2"  (1.575 m)   Wt 134 lb (60.8 kg)   LMP 11/01/2008   BMI 24.51 kg/m     General appearance: alert, cooperative and appears stated age   ASSESSMENT  Osteopenia.  Recent fracture. Intolerant of Actonel.   PLAN  Discussion of alternatives for treatment - Evista, Prolia, Reclast - risks and benefits.   Declines prescription tx.  Will continue her vit D 2000 IU daily.  Continue calcium exposure through diet.  Add one Tums daily 500 or 600 mg.  Next bone density in 2 years.  Fall risk reduction discussed.     An After Visit Summary was printed and given to the patient.  _15_____ minutes face to face time of which over 50% was spent in counseling.

## 2015-12-17 ENCOUNTER — Ambulatory Visit (INDEPENDENT_AMBULATORY_CARE_PROVIDER_SITE_OTHER): Payer: BLUE CROSS/BLUE SHIELD | Admitting: Family Medicine

## 2015-12-17 ENCOUNTER — Encounter: Payer: Self-pay | Admitting: Family Medicine

## 2015-12-17 VITALS — BP 124/80 | HR 68 | Temp 98.3°F | Wt 130.6 lb

## 2015-12-17 DIAGNOSIS — H1031 Unspecified acute conjunctivitis, right eye: Secondary | ICD-10-CM | POA: Diagnosis not present

## 2015-12-17 DIAGNOSIS — R05 Cough: Secondary | ICD-10-CM | POA: Diagnosis not present

## 2015-12-17 DIAGNOSIS — R059 Cough, unspecified: Secondary | ICD-10-CM

## 2015-12-17 DIAGNOSIS — J069 Acute upper respiratory infection, unspecified: Secondary | ICD-10-CM

## 2015-12-17 MED ORDER — AMOXICILLIN 875 MG PO TABS: 875.0000 mg | ORAL_TABLET | Freq: Two times a day (BID) | ORAL | 0 refills | Status: DC

## 2015-12-17 MED ORDER — POLYMYXIN B-TRIMETHOPRIM 10000-0.1 UNIT/ML-% OP SOLN: 1.0000 [drp] | Freq: Four times a day (QID) | OPHTHALMIC | 0 refills | Status: DC

## 2015-12-17 NOTE — Progress Notes (Signed)
Subjective: Chief Complaint  Patient presents with  . sick    right ear pain, right eye red, sinus pressure, headache, coughing, drainage,      Laura Baker is a 60 y.o. female who presents for 1 week history of rhinorrhea, right eye with redness and drainage, right ear pain, chills, sore throat, and coughing that is occasionally productive. States left upper back starting hurting 3 days ago.   Denies fever, chest pain, palpitations, DOE, abdominal pain, N/V/D.   History of sinus infections, bronchitis. No pneumonia or asthma or COPD. Is not a smoker.  No recent antibiotic.    Treatment to date: advil sinus and congestion, Robitussin and congestion DM, and eye wash for right eye.  Denies sick contacts.  No other aggravating or relieving factors.  No other c/o.  ROS as in subjective.   Objective: Vitals:   12/17/15 0958  BP: 124/80  Pulse: 68  Temp: 98.3 F (36.8 C)    General appearance: Alert, WD/WN, no distress, mildly ill appearing                             Skin: warm, no rash                           Head: no sinus tenderness                            Eyes: right conjunctiva injected, clear drainage, left conjunctiva normal, corneas clear, PERRLA, EOMs intact.                             Ears: pearly TMs, external ear canals normal                          Nose: septum midline, turbinates swollen, with erythema and clear discharge             Mouth/throat: MMM, tongue normal, mild pharyngeal erythema, no edema or exudate                           Neck: supple, no adenopathy, no thyromegaly, nontender                          Heart: RRR, normal S1, S2, no murmurs                         Lungs: CTA bilaterally, no wheezes, rales, or rhonchi      Assessment: Acute URI  Cough  Acute conjunctivitis of right eye, unspecified acute conjunctivitis type   Plan: Discussed diagnosis and treatment of URI. Plan to start her on Abx due to length of illness. She  appears to be worsening.  Suggested symptomatic OTC remedies as well. Salt water gargles, stay well hydrated. Nasal saline spray for congestion.  Tylenol or Ibuprofen OTC for fever and malaise.  Suspect that she has bacterial conjunctivitis and will treat with polytrim. Advised on contagious nature and to do frequent hand washing.  She will follow up if not back to baseline after completing treatment.

## 2015-12-17 NOTE — Patient Instructions (Signed)
  Try Aquaphor for your finger. Keep your hands moisturized.  Stay well hydrated and treat your symptoms. If you are not back to baseline after finishing the antibiotic then call and let us know.  Wash your hands often and avoid touching your eye.    Bacterial Conjunctivitis Introduction Bacterial conjunctivitis is an infection of your conjunctiva. This is the clear membrane that covers the white part of your eye and the inner surface of your eyelid. This condition can make your eye:  Red or pink.  Itchy. This condition is caused by bacteria. This condition spreads very easily from person to person (is contagious) and from one eye to the other eye. Follow these instructions at home: Medicines  Take or apply your antibiotic medicine as told by your doctor. Do not stop taking or applying the antibiotic even if you start to feel better.  Take or apply over-the-counter and prescription medicines only as told by your doctor.  Do not touch your eyelid with the eye drop bottle or the ointment tube. Managing discomfort  Wipe any fluid from your eye with a warm, wet washcloth or a cotton ball.  Place a cool, clean washcloth on your eye. Do this for 10-20 minutes, 3-4 times per day. General instructions  Do not wear contact lenses until the irritation is gone. Wear glasses until your doctor says it is okay to wear contacts.  Do not wear eye makeup until your symptoms are gone. Throw away any old makeup.  Change or wash your pillowcase every day.  Do not share towels or washcloths with anyone.  Wash your hands often with soap and water. Use paper towels to dry your hands.  Do not touch or rub your eyes.  Do not drive or use heavy machinery if your vision is blurry. Contact a doctor if:  You have a fever.  Your symptoms do not get better after 10 days. Get help right away if:  You have a fever and your symptoms suddenly get worse.  You have very bad pain when you move your  eye.  Your face:  Hurts.  Is red.  Is swollen.  You have sudden loss of vision. This information is not intended to replace advice given to you by your health care provider. Make sure you discuss any questions you have with your health care provider. Document Released: 10/28/2007 Document Revised: 06/26/2015 Document Reviewed: 10/31/2014  2017 Elsevier

## 2016-01-02 ENCOUNTER — Telehealth: Payer: Self-pay | Admitting: Family Medicine

## 2016-01-02 NOTE — Telephone Encounter (Signed)
Pt is going to call her insurance to see how much it is then is going to cal [place] back to set up a appt

## 2016-04-09 ENCOUNTER — Telehealth: Payer: Self-pay

## 2016-04-09 ENCOUNTER — Other Ambulatory Visit: Payer: Self-pay | Admitting: Obstetrics and Gynecology

## 2016-04-09 ENCOUNTER — Other Ambulatory Visit (INDEPENDENT_AMBULATORY_CARE_PROVIDER_SITE_OTHER): Payer: BLUE CROSS/BLUE SHIELD

## 2016-04-09 DIAGNOSIS — Z0000 Encounter for general adult medical examination without abnormal findings: Secondary | ICD-10-CM

## 2016-04-09 LAB — COMPREHENSIVE METABOLIC PANEL
ALK PHOS: 66 U/L (ref 33–130)
ALT: 13 U/L (ref 6–29)
AST: 19 U/L (ref 10–35)
Albumin: 4.3 g/dL (ref 3.6–5.1)
BUN: 15 mg/dL (ref 7–25)
CHLORIDE: 103 mmol/L (ref 98–110)
CO2: 29 mmol/L (ref 20–31)
CREATININE: 0.93 mg/dL (ref 0.50–0.99)
Calcium: 9.5 mg/dL (ref 8.6–10.4)
GLUCOSE: 93 mg/dL (ref 65–99)
POTASSIUM: 4.7 mmol/L (ref 3.5–5.3)
SODIUM: 140 mmol/L (ref 135–146)
TOTAL PROTEIN: 7.6 g/dL (ref 6.1–8.1)
Total Bilirubin: 0.7 mg/dL (ref 0.2–1.2)

## 2016-04-09 LAB — LIPID PANEL
Cholesterol: 185 mg/dL (ref <200)
HDL: 66 mg/dL (ref >50)
LDL CALC: 102 mg/dL — AB (ref <100)
Total CHOL/HDL Ratio: 2.8 Ratio (ref <5.0)
Triglycerides: 87 mg/dL (ref <150)
VLDL: 17 mg/dL (ref <30)

## 2016-04-09 LAB — CBC
HCT: 44.8 % (ref 35.0–45.0)
Hemoglobin: 15.1 g/dL (ref 11.7–15.5)
MCH: 31.1 pg (ref 27.0–33.0)
MCHC: 33.7 g/dL (ref 32.0–36.0)
MCV: 92.4 fL (ref 80.0–100.0)
MPV: 9.8 fL (ref 7.5–12.5)
Platelets: 225 10*3/uL (ref 140–400)
RBC: 4.85 MIL/uL (ref 3.80–5.10)
RDW: 13.8 % (ref 11.0–15.0)
WBC: 4.4 10*3/uL (ref 3.8–10.8)

## 2016-04-09 LAB — TSH: TSH: 2.96 m[IU]/L

## 2016-04-09 NOTE — Telephone Encounter (Signed)
Called patient to discuss changing appointment from P.Grubb, NP on 04-14-16 to Dr.Silva on 04-12-16. Dr.Silva reviewed chart and felt maybe she should see patient every other year to evaluate vaginal mesh erosion. Left message on voicemail for patient to call me back or call and speak with Starla to reschedule appointment for [facility]EX.

## 2016-04-09 NOTE — Telephone Encounter (Signed)
Thank you for the update. You may close the encounter. 

## 2016-04-09 NOTE — Telephone Encounter (Signed)
Patient returned call and rescheduled to 04/14/16 with Dr. Quincy Simmonds at 10:00 AM to arrive at 9:45 AM. Patient was seen last year on 04/13/16 so 04/12/16 is too soon. Patient requested an appointment on the same day as her original appointment due to work requirements. Patient appreciative.  Cc: Dr. Quincy Simmonds for [place].

## 2016-04-10 LAB — HEMOGLOBIN A1C
HEMOGLOBIN A1C: 5.2 % (ref <5.7)
Mean Plasma Glucose: 103 mg/dL

## 2016-04-10 LAB — VITAMIN D 25 HYDROXY (VIT D DEFICIENCY, FRACTURES): VIT D 25 HYDROXY: 31 ng/mL (ref 30–100)

## 2016-04-12 NOTE — Progress Notes (Signed)
61 y.o. G48P3003 Married Caucasian female here for annual exam.    Labs done last week.  LDL was 102, otherwise normal cholesterol panel, hemoglobin A1C, CBC, CMP, thyroid and vit D.   Using vaginal estrogen cream twice a week.  Can forget.   Can be constipated at times.  Voiding well.  No urinary incontinence.  Sexually active.  No coital bleeding.  No female or female dyspareunia.   Works as a Public librarian.   PCP: Jill Alexanders, MD  Patient's last menstrual period was 11/01/2008.           Sexually active: Yes.   female The current method of family planning is status post hysterectomy.    Exercising: Yes.    walking Smoker:  no  Health Maintenance: Pap: 2010 Negative  History of abnormal Pap:  no MMG: 06-25-15 Density C/Neg/BiRads1:[facility] Colonoscopy:   07/07/12, Tubular Adenoma with Dr.Mann; repeat in 5 years BMD: 05-14-15  Result:Osteopenia:[facility] TDaP:  2010 Gardasil:   N/A HIV:  NR 04/10/15 Hep C:  Neg 04/10/15. Screening Labs:  Hb today: drawn 04-09-16, Urine today: not done   reports that she has never smoked. She has never used smokeless tobacco. She reports that she does not drink alcohol or use drugs.  Past Medical History:  Diagnosis Date  . Blood transfusion without reported diagnosis 2006  . Broken foot 05/2015   -right foot  . Cataract   . Osteopenia 2017   bone fracture April 2017.  Did not tolerate Actonel.  Do bone density in 2019.    Past Surgical History:  Procedure Laterality Date  . ABDOMINAL HYSTERECTOMY  11/2008  . ABDOMINAL SACROCOLPOPEXY    . CATARACT EXTRACTION Right 2008  . CATARACT EXTRACTION Left 05/2014  . CYST REMOVAL NECK  2002  . ROBOTIC ASSISTED LAPAROSCOPIC SACROCOLPOPEXY  06/2010   --L.Yolonda Kida    Current Outpatient Prescriptions  Medication Sig Dispense Refill  . Cholecalciferol (VITAMIN D PO) Take by mouth.    . Cholecalciferol (VITAMIN D3) 2000 units capsule Take by mouth.    . estradiol (ESTRACE VAGINAL) 0.1 MG/GM vaginal cream  Place 1/2 gram per vagina at bedtime 2 - 3 times per week. 42.5 g 3  . Multiple Vitamins-Minerals (CENTRUM SILVER ADULT 50+ PO) Take 1 tablet by mouth daily.    Marland Kitchen trimethoprim-polymyxin b (POLYTRIM) ophthalmic solution Place 1 drop into the right eye every 6 (six) hours. 10 mL 0   No current facility-administered medications for this visit.     Family History  Problem Relation Age of Onset  . Hypertension Mother   . Congestive Heart Failure Mother   . Thyroid disease Mother   . Osteoporosis Mother   . Stroke Mother   . Diabetes Mother   . Lung cancer Father   . Diabetes Sister   . Thyroid disease Sister   . Osteoporosis Sister   . Thyroid disease Brother   . Leukemia Sister   . Thyroid disease Sister   . Diabetes Sister   . Kidney disease Sister   . Scleroderma Sister   . Thyroid disease Brother   . Rashes / Skin problems Other     ROS:  Pertinent items are noted in HPI.  Otherwise, a comprehensive ROS was negative.  Exam:   BP 130/78 (BP Location: Right Arm, Patient Position: Sitting, Cuff Size: Normal)   Pulse 70   Resp 16   Ht 5' 1.5" (1.562 m)   Wt 128 lb 9.6 oz (58.3 kg)   LMP 11/01/2008  BMI 23.91 kg/m     General appearance: alert, cooperative and appears stated age Head: Normocephalic, without obvious abnormality, atraumatic Neck: no adenopathy, supple, symmetrical, trachea midline and thyroid normal to inspection and palpation Lungs: clear to auscultation bilaterally Breasts: normal appearance, no masses or tenderness, No nipple retraction or dimpling, No nipple discharge or bleeding, No axillary or supraclavicular adenopathy Heart: regular rate and rhythm Abdomen: soft, non-tender; no masses, no organomegaly Extremities: extremities normal, atraumatic, no cyanosis or edema Skin: Skin color, texture, turgor normal. No rashes or lesions Lymph nodes: Cervical, supraclavicular, and axillary nodes normal. No abnormal inguinal nodes palpated Neurologic: Grossly  normal  Pelvic: External genitalia:  no lesions              Urethra:  normal appearing urethra with no masses, tenderness or lesions              Bartholins and Skenes: normal                 Vagina:  Patch of 0.5 - 1 cm mesh erosion at vaginal vault.               Cervix:  Absent.              Pap taken: No. Bimanual Exam:  Uterus:  Absent.               Adnexa: no mass, fullness, tenderness              Rectal exam: Yes.  .  Confirms.              Anus:  normal sphincter tone, no lesions  Chaperone was present for exam.  Assessment:   Well woman visit with normal exam. Status post abdominal hysterectomy.  Ovaries remain.  Hx vaginal vault mesh erosion.  Asymptomatic. Osteopenia. Hx foot fracture. Intolertant of Actonel.  Off all prescription medication.  Plan: Mammogram screening discussed. Recommended self breast awareness. Pap and HR HPV as above. Guidelines for Calcium, Vitamin D, regular exercise program including cardiovascular and weight bearing exercise. BMD in 2019.  Continue with vaginal Estrace cream 1/2 gm pv at hs 2 - 3 times per week. Discussed potential increased risk of breast cancer.  Wellness form signed and copy of labs to patient.  Follow up annually and prn.      After visit summary provided.

## 2016-04-14 ENCOUNTER — Ambulatory Visit (INDEPENDENT_AMBULATORY_CARE_PROVIDER_SITE_OTHER): Payer: BLUE CROSS/BLUE SHIELD | Admitting: Obstetrics and Gynecology

## 2016-04-14 ENCOUNTER — Encounter: Payer: Self-pay | Admitting: Obstetrics and Gynecology

## 2016-04-14 ENCOUNTER — Ambulatory Visit: Payer: BLUE CROSS/BLUE SHIELD | Admitting: Nurse Practitioner

## 2016-04-14 VITALS — BP 130/78 | HR 70 | Resp 16 | Ht 61.5 in | Wt 128.6 lb

## 2016-04-14 DIAGNOSIS — Z01419 Encounter for gynecological examination (general) (routine) without abnormal findings: Secondary | ICD-10-CM

## 2016-04-14 MED ORDER — ESTRADIOL 0.1 MG/GM VA CREA: TOPICAL_CREAM | VAGINAL | 3 refills | Status: DC

## 2016-04-14 NOTE — Patient Instructions (Signed)

## 2016-07-23 ENCOUNTER — Encounter: Payer: Self-pay | Admitting: Obstetrics and Gynecology

## 2016-12-04 LAB — GLUCOSE, POCT (MANUAL RESULT ENTRY): POC Glucose: 86 mg/dl (ref 70–99)

## 2017-04-08 ENCOUNTER — Other Ambulatory Visit (INDEPENDENT_AMBULATORY_CARE_PROVIDER_SITE_OTHER): Payer: Commercial Managed Care - PPO

## 2017-04-08 DIAGNOSIS — Z0000 Encounter for general adult medical examination without abnormal findings: Secondary | ICD-10-CM | POA: Diagnosis not present

## 2017-04-09 LAB — TSH: TSH: 1.78 u[IU]/mL (ref 0.450–4.500)

## 2017-04-09 LAB — COMPREHENSIVE METABOLIC PANEL
ALK PHOS: 66 IU/L (ref 39–117)
ALT: 17 IU/L (ref 0–32)
AST: 21 IU/L (ref 0–40)
Albumin/Globulin Ratio: 1.4 (ref 1.2–2.2)
Albumin: 4.2 g/dL (ref 3.6–4.8)
BUN/Creatinine Ratio: 17 (ref 12–28)
BUN: 15 mg/dL (ref 8–27)
Bilirubin Total: 0.6 mg/dL (ref 0.0–1.2)
CO2: 27 mmol/L (ref 20–29)
CREATININE: 0.89 mg/dL (ref 0.57–1.00)
Calcium: 9.4 mg/dL (ref 8.7–10.3)
Chloride: 101 mmol/L (ref 96–106)
GFR calc Af Amer: 80 mL/min/{1.73_m2} (ref >59)
GFR calc non Af Amer: 69 mL/min/{1.73_m2} (ref >59)
Globulin, Total: 3 g/dL (ref 1.5–4.5)
Glucose: 91 mg/dL (ref 65–99)
Potassium: 4.3 mmol/L (ref 3.5–5.2)
Sodium: 142 mmol/L (ref 134–144)
Total Protein: 7.2 g/dL (ref 6.0–8.5)

## 2017-04-09 LAB — CBC
HEMATOCRIT: 41 % (ref 34.0–46.6)
Hemoglobin: 13.9 g/dL (ref 11.1–15.9)
MCH: 31.5 pg (ref 26.6–33.0)
MCHC: 33.9 g/dL (ref 31.5–35.7)
MCV: 93 fL (ref 79–97)
PLATELETS: 227 10*3/uL (ref 150–379)
RBC: 4.41 x10E6/uL (ref 3.77–5.28)
RDW: 13.7 % (ref 12.3–15.4)
WBC: 4.2 10*3/uL (ref 3.4–10.8)

## 2017-04-09 LAB — LIPID PANEL
CHOL/HDL RATIO: 2.9 ratio (ref 0.0–4.4)
Cholesterol, Total: 185 mg/dL (ref 100–199)
HDL: 64 mg/dL (ref >39)
LDL Calculated: 108 mg/dL — ABNORMAL HIGH (ref 0–99)
Triglycerides: 63 mg/dL (ref 0–149)
VLDL Cholesterol Cal: 13 mg/dL (ref 5–40)

## 2017-04-09 LAB — VITAMIN D 25 HYDROXY (VIT D DEFICIENCY, FRACTURES): Vit D, 25-Hydroxy: 27.7 ng/mL — ABNORMAL LOW (ref 30.0–100.0)

## 2017-04-15 ENCOUNTER — Encounter: Payer: Self-pay | Admitting: Obstetrics and Gynecology

## 2017-04-15 ENCOUNTER — Other Ambulatory Visit: Payer: Self-pay

## 2017-04-15 ENCOUNTER — Ambulatory Visit: Payer: Commercial Managed Care - PPO | Admitting: Obstetrics and Gynecology

## 2017-04-15 VITALS — BP 136/80 | HR 84 | Resp 14 | Ht 61.75 in | Wt 128.0 lb

## 2017-04-15 DIAGNOSIS — Z01419 Encounter for gynecological examination (general) (routine) without abnormal findings: Secondary | ICD-10-CM

## 2017-04-15 MED ORDER — LIDOCAINE-HYDROCORTISONE ACE 3-0.5 % RE CREA: 1.0000 | TOPICAL_CREAM | Freq: Two times a day (BID) | RECTAL | 0 refills | Status: DC

## 2017-04-15 MED ORDER — ESTRADIOL 0.1 MG/GM VA CREA: TOPICAL_CREAM | VAGINAL | 3 refills | Status: DC

## 2017-04-15 NOTE — Progress Notes (Signed)
62 y.o. [id number] Married Caucasian female here for annual exam.    Constipation.  Not eating many fruits or vegetables.   Using vaginal estrogen cream.  Has vaginal mesh erosion.  No dyspareunia, female or female.  Labs reviewed with patient that were done on 04/08/17. Vit D slightly low.  PCP: [facility]   Patient's last menstrual period was 11/01/2008.           Sexually active: Yes.    The current method of family planning is status post hysterectomy.    Exercising: Yes.    occasional walking Smoker:  no  Health Maintenance: Pap:  2010 Negative History of abnormal Pap:  noMMG:  06/25/16 BIRADS 1 negative/density c Colonoscopy:  07/07/12, Tubular Adenoma with Dr.Mann; repeat in 5 years BMD:   05/14/15  Result  Osteopenia  TDaP:  2010 Gardasil:   n/a HIV and Hep C: Neg 04/10/15 Screening Labs: patient had labs here 04/08/17   reports that  has never smoked. she has never used smokeless tobacco. She reports that she does not drink alcohol or use drugs.  Past Medical History:  Diagnosis Date  . Blood transfusion without reported diagnosis 2006  . Broken foot 05/2015   -right foot  . Cataract   . Low vitamin D level   . Osteopenia 2017   bone fracture April 2017.  Did not tolerate Actonel.  Do bone density in 2019.    Past Surgical History:  Procedure Laterality Date  . ABDOMINAL HYSTERECTOMY  11/2008  . ABDOMINAL SACROCOLPOPEXY    . CATARACT EXTRACTION Right 2008  . CATARACT EXTRACTION Left 05/2014  . CYST REMOVAL NECK  2002  . ROBOTIC ASSISTED LAPAROSCOPIC SACROCOLPOPEXY  06/2010   --Laura Baker    Current Outpatient Medications  Medication Sig Dispense Refill  . Cholecalciferol (VITAMIN D3) 2000 units capsule Take by mouth.    . estradiol (ESTRACE VAGINAL) 0.1 MG/GM vaginal cream Place 1/2 gram per vagina at bedtime 2 - 3 times per week. 42.5 g 3  . Multiple Vitamins-Minerals (CENTRUM SILVER ADULT 50+ PO) Take 1 tablet by mouth daily.    Marland Kitchen  lidocaine-hydrocortisone (ANAMANTEL HC) 3-0.5 % CREA Place 1 Applicatorful rectally 2 (two) times daily. 28.3 g 0   No current facility-administered medications for this visit.     Family History  Problem Relation Age of Onset  . Hypertension Mother   . Congestive Heart Failure Mother   . Thyroid disease Mother   . Osteoporosis Mother   . Stroke Mother   . Diabetes Mother   . Lung cancer Father   . Diabetes Sister   . Thyroid disease Sister   . Osteoporosis Sister   . Thyroid disease Brother   . Leukemia Sister   . Thyroid disease Sister   . Diabetes Sister   . Kidney disease Sister   . Scleroderma Sister   . Thyroid disease Brother   . Rashes / Skin problems Other     ROS:  Pertinent items are noted in HPI.  Otherwise, a comprehensive ROS was negative.  Exam:   BP 136/80 (BP Location: Right Arm, Patient Position: Sitting, Cuff Size: Normal)   Pulse 84   Resp 14   Ht 5' 1.75" (1.568 m)   Wt 128 lb (58.1 kg)   LMP 11/01/2008   BMI 23.60 kg/m     General appearance: alert, cooperative and appears stated age Head: Normocephalic, without obvious abnormality, atraumatic Neck: no adenopathy, supple, symmetrical, trachea midline and thyroid normal to inspection  and palpation Lungs: clear to auscultation bilaterally Breasts: normal appearance, no masses or tenderness, No nipple retraction or dimpling, No nipple discharge or bleeding, No axillary or supraclavicular adenopathy Heart: regular rate and rhythm Abdomen: soft, non-tender; no masses, no organomegaly Extremities: extremities normal, atraumatic, no cyanosis or edema Skin: Skin color, texture, turgor normal. No rashes or lesions Lymph nodes: Cervical, supraclavicular, and axillary nodes normal. No abnormal inguinal nodes palpated Neurologic: Grossly normal  Pelvic: External genitalia:  no lesions              Urethra:  normal appearing urethra with no masses, tenderness or lesions              Bartholins and Skenes:  normal                 Vagina: right vaginal cuff mesh erosion noted.  Almost second degree rectocele.              Cervix:  Absent.              Pap taken: No. Bimanual Exam:  Uterus:  Absent.  Mesh erosion palpable.              Adnexa: no mass, fullness, tenderness              Rectal exam: Yes.  .  Confirms.              Anus:  normal sphincter tone, inflamed hemorrhoids noted.  Chaperone was present for exam.  Assessment:   Well woman visit with normal exam. Status post abdominal hysterectomy.  Ovaries remain.  Hx vaginal vault mesh erosion.  Asymptomatic.  Has consulted with the pelvic surgeon who did her surgery and she is doing observational management now for years.  Osteopenia. Hx foot fracture. Intolertant of Actonel.  Off all prescription medication. Low vit D.  Rectocele.  Constipation.  Hemorrhoids.   Plan: Mammogram screening discussed. Recommended self breast awareness. Pap and HR HPV as above. Guidelines for Calcium, Vitamin D, regular exercise program including cardiovascular and weight bearing exercise. Increase vit D to 3000 IU daily. BMD this year at [facility].  I discussed her rectocele and the possibility of surgical repair if she chooses. Increase fiber in diet, increase water, Colace stool softener, and exercise.  She will call Dr. Collene Mares to schedule colonoscopy.  [facility] Rx.  Refill of vaginal estrogen cream.  Discussed potential effect on breast cancer.  Follow up annually and prn.     fter visit summary provided.

## 2017-04-15 NOTE — Patient Instructions (Signed)

## 2017-04-16 ENCOUNTER — Encounter: Payer: Self-pay | Admitting: Obstetrics and Gynecology

## 2017-04-27 ENCOUNTER — Other Ambulatory Visit (INDEPENDENT_AMBULATORY_CARE_PROVIDER_SITE_OTHER): Payer: Commercial Managed Care - PPO

## 2017-04-27 ENCOUNTER — Telehealth: Payer: Self-pay | Admitting: Certified Nurse Midwife

## 2017-04-27 DIAGNOSIS — Z01419 Encounter for gynecological examination (general) (routine) without abnormal findings: Secondary | ICD-10-CM

## 2017-04-27 NOTE — Telephone Encounter (Signed)
Ok for hemoglobin A1C as a future lab.  Dx will need to be well woman visit.

## 2017-04-27 NOTE — Telephone Encounter (Signed)
Spoke with patient. Lab appointment scheduled for today at 10:30am.  Future lab order placed.   Patient is agreeable to disposition. Will close encounter.

## 2017-04-27 NOTE — Telephone Encounter (Signed)
Patient says she did not have results of an A1C sugar test that was needed for insurance purposes.

## 2017-04-27 NOTE — Telephone Encounter (Signed)
Spoke with patient. Patient is completing paperwork for insurance, was supposed to have hemoglobin A1c drawn with labs at [facility] on 04/15/17. Patient states she does not see this lab on results.   Advised hemoglobin A1c was not drawn. Advised patient can review with Dr. Quincy Simmonds and return call. Patient agreeable.   Dr. Quincy Simmonds, ok to schedule lab appt for hemoglobin A1c?

## 2017-04-28 ENCOUNTER — Telehealth: Payer: Self-pay | Admitting: *Deleted

## 2017-04-28 LAB — HEMOGLOBIN A1C
Est. average glucose Bld gHb Est-mCnc: 111 mg/dL
Hgb A1c MFr Bld: 5.5 % (ref 4.8–5.6)

## 2017-04-28 NOTE — Telephone Encounter (Signed)
Spoke with patient. Advised as seen below per Dr. Quincy Simmonds. Patient request copy of labs be faxed to [facility] at [phone] and for copy to mailed to home address on file, confirmed address.   Results faxed as requested and copy placed in outgoing mail to patient.   Patient is agreeable to disposition. Will close encounter.

## 2017-04-28 NOTE — Telephone Encounter (Signed)
-----   Message from Nunzio Cobbs, MD sent at 04/28/2017  8:13 AM EDT ----- Please report HgbA1C of 5.5 to patient.  This is normal. I expect she may want a copy.

## 2017-04-28 NOTE — Telephone Encounter (Signed)
Notes recorded by Burnice Logan, RN on 04/28/2017 at 10:00 AM EDT Left message to call Sharee Pimple at [phone].

## 2017-06-29 DIAGNOSIS — M810 Age-related osteoporosis without current pathological fracture: Secondary | ICD-10-CM | POA: Diagnosis not present

## 2017-06-29 DIAGNOSIS — Z1231 Encounter for screening mammogram for malignant neoplasm of breast: Secondary | ICD-10-CM | POA: Diagnosis not present

## 2017-06-29 DIAGNOSIS — M8589 Other specified disorders of bone density and structure, multiple sites: Secondary | ICD-10-CM | POA: Diagnosis not present

## 2017-06-30 DIAGNOSIS — Z1211 Encounter for screening for malignant neoplasm of colon: Secondary | ICD-10-CM | POA: Diagnosis not present

## 2017-06-30 DIAGNOSIS — K219 Gastro-esophageal reflux disease without esophagitis: Secondary | ICD-10-CM | POA: Diagnosis not present

## 2017-06-30 DIAGNOSIS — K625 Hemorrhage of anus and rectum: Secondary | ICD-10-CM | POA: Diagnosis not present

## 2017-07-06 ENCOUNTER — Telehealth: Payer: Self-pay | Admitting: *Deleted

## 2017-07-06 NOTE — Telephone Encounter (Signed)
Spoke with patient, advised of BMD results per Dr. Quincy Simmonds. OV scheduled for 07/13/17 at 4pm with Dr. Quincy Simmonds. Patient verbalizes understanding and is agreeable.   BMD results back to Dr. Quincy Simmonds to hold for consult.

## 2017-07-06 NOTE — Telephone Encounter (Signed)
Left message to call Sharee Pimple at [phone].  See [facility] BMD results dated 06/29/17 -Osteoporosis, OV recommended with Dr. Quincy Simmonds to discuss.

## 2017-07-06 NOTE — Telephone Encounter (Signed)
Patient returning call to Jill. °

## 2017-07-13 ENCOUNTER — Other Ambulatory Visit: Payer: Self-pay

## 2017-07-13 ENCOUNTER — Encounter: Payer: Self-pay | Admitting: Obstetrics and Gynecology

## 2017-07-13 ENCOUNTER — Ambulatory Visit (INDEPENDENT_AMBULATORY_CARE_PROVIDER_SITE_OTHER): Payer: Commercial Managed Care - PPO | Admitting: Obstetrics and Gynecology

## 2017-07-13 VITALS — BP 120/66 | HR 76 | Resp 16 | Ht 61.75 in | Wt 129.0 lb

## 2017-07-13 DIAGNOSIS — M810 Age-related osteoporosis without current pathological fracture: Secondary | ICD-10-CM

## 2017-07-13 NOTE — Progress Notes (Signed)
GYNECOLOGY  VISIT   HPI: 62 y.o.   Married  Caucasian  female   [id number] with Patient's last menstrual period was 11/01/2008.   here for BMD consult.  T score -2.7.  Low vit D 27.7 on 04/08/17. Cr. 0.89. TSH 1.780.  Took Actonel in the 2017 due to osteopenia and increased risk of fracture by FRAX.  It caused nausea and dizziness.  Hx stress fracture.   Mother and maternal grandmother had osteoporosis.  Both petite body frame.   GYNECOLOGIC HISTORY: Patient's last menstrual period was 11/01/2008. Contraception:  Hysterectomy Menopausal hormone therapy:  Estradiol cream Last mammogram:  2019 -- copy with Dr. Quincy Simmonds Last pap smear:   2010 Negative        OB History    Gravida  3   Para  3   Term  3   Preterm  0   AB  0   Living  3     SAB  0   TAB  0   Ectopic  0   Multiple  0   Live Births  3              Patient Active Problem List   Diagnosis Date Noted  . Injury of left foot including toes 05/21/2015  . Vaginal erosion due to surgical mesh ([place]) 04/09/2013    Past Medical History:  Diagnosis Date  . Blood transfusion without reported diagnosis 2006  . Broken foot 05/2015   -right foot  . Cataract   . Low vitamin D level   . Osteopenia 2017   bone fracture April 2017.  Did not tolerate Actonel.  Do bone density in 2019.    Past Surgical History:  Procedure Laterality Date  . ABDOMINAL HYSTERECTOMY  11/2008  . ABDOMINAL SACROCOLPOPEXY    . CATARACT EXTRACTION Right 2008  . CATARACT EXTRACTION Left 05/2014  . CYST REMOVAL NECK  2002  . ROBOTIC ASSISTED LAPAROSCOPIC SACROCOLPOPEXY  06/2010   --L.[place]    Current Outpatient Medications  Medication Sig Dispense Refill  . Cholecalciferol (VITAMIN D3) 2000 units capsule Take by mouth.    . estradiol (ESTRACE VAGINAL) 0.1 MG/GM vaginal cream Place 1/2 gram per vagina at bedtime 2 - 3 times per week. 42.5 g 3  . lidocaine-hydrocortisone (ANAMANTEL HC) 3-0.5 % CREA Place 1 Applicatorful  rectally 2 (two) times daily. 28.3 g 0  . Multiple Vitamins-Minerals (CENTRUM SILVER ADULT 50+ PO) Take 1 tablet by mouth daily.     No current facility-administered medications for this visit.      ALLERGIES: Sulfa antibiotics  Family History  Problem Relation Age of Onset  . Hypertension Mother   . Congestive Heart Failure Mother   . Thyroid disease Mother   . Osteoporosis Mother   . Stroke Mother   . Diabetes Mother   . Lung cancer Father   . Diabetes Sister   . Thyroid disease Sister   . Osteoporosis Sister   . Thyroid disease Brother   . Leukemia Sister   . Thyroid disease Sister   . Diabetes Sister   . Kidney disease Sister   . Scleroderma Sister   . Thyroid disease Brother   . Rashes / Skin problems Other     Social History   Socioeconomic History  . Marital status: Married    Spouse name: Not on file  . Number of children: Not on file  . Years of education: Not on file  . Highest education level: Not on file  Occupational History  . Not on file  Social Needs  . Financial resource strain: Not on file  . Food insecurity:    Worry: Not on file    Inability: Not on file  . Transportation needs:    Medical: Not on file    Non-medical: Not on file  Tobacco Use  . Smoking status: Never Smoker  . Smokeless tobacco: Never Used  Substance and Sexual Activity  . Alcohol use: No    Alcohol/week: 0.0 oz  . Drug use: No  . Sexual activity: Yes    Partners: Male    Birth control/protection: Surgical    Comment: R-TLH--still has ovaries  Lifestyle  . Physical activity:    Days per week: Not on file    Minutes per session: Not on file  . Stress: Not on file  Relationships  . Social connections:    Talks on phone: Not on file    Gets together: Not on file    Attends religious service: Not on file    Active member of club or organization: Not on file    Attends meetings of clubs or organizations: Not on file    Relationship status: Not on file  . Intimate  partner violence:    Fear of current or ex partner: Not on file    Emotionally abused: Not on file    Physically abused: Not on file    Forced sexual activity: Not on file  Other Topics Concern  . Not on file  Social History Narrative  . Not on file    Review of Systems  PHYSICAL EXAMINATION:    BP 120/66 (BP Location: Right Arm, Patient Position: Sitting, Cuff Size: Normal)   Pulse 76   Resp 16   Ht 5' 1.75" (1.568 m)   Wt 129 lb (58.5 kg)   LMP 11/01/2008   BMI 23.79 kg/m     General appearance: alert, cooperative and appears stated age   ASSESSMENT  Osteoporosis.  Hx stress fracture.  Intolerance to Actonel.  PLAN  Bone density report discussed.  Counseled regarding osteoporosis, risk factors, treatment options (bisphosphonates, Evista, Calcitonin, Prolia, Forteo), and fall risk reduction. Discussed options of Evista versus Prolia, risks and benefits. Patient would like to consider choices. Reading materials provided. Discussed importance of weight bearing exercise and daily calcium 1200 mg daily and vit D 1000 IU daily. Will check PTH, calcium. Patient does understand that osteoporosis is a silent disease until fracture occurs and understands that there is significant morbidity associated with osteoporotic fracture.    An After Visit Summary was printed and given to the patient.  __25____ minutes face to face time of which over 50% was spent in counseling.

## 2017-07-13 NOTE — Patient Instructions (Signed)
Denosumab injection What is this medicine? DENOSUMAB (den oh sue mab) slows bone breakdown. Prolia is used to treat osteoporosis in women after menopause and in men. Delton See is used to treat a high calcium level due to cancer and to prevent bone fractures and other bone problems caused by multiple myeloma or cancer bone metastases. Delton See is also used to treat giant cell tumor of the bone. This medicine may be used for other purposes; ask your health care provider or pharmacist if you have questions. COMMON BRAND NAME(S): Prolia, XGEVA What should I tell my health care provider before I take this medicine? They need to know if you have any of these conditions: -dental disease -having surgery or tooth extraction -infection -kidney disease -low levels of calcium or Vitamin D in the blood -malnutrition -on hemodialysis -skin conditions or sensitivity -thyroid or parathyroid disease -an unusual reaction to denosumab, other medicines, foods, dyes, or preservatives -pregnant or trying to get pregnant -breast-feeding How should I use this medicine? This medicine is for injection under the skin. It is given by a health care professional in a hospital or clinic setting. If you are getting Prolia, a special MedGuide will be given to you by the pharmacist with each prescription and refill. Be sure to read this information carefully each time. For Prolia, talk to your pediatrician regarding the use of this medicine in children. Special care may be needed. For Delton See, talk to your pediatrician regarding the use of this medicine in children. While this drug may be prescribed for children as young as 13 years for selected conditions, precautions do apply. Overdosage: If you think you have taken too much of this medicine contact a poison control center or emergency room at once. NOTE: This medicine is only for you. Do not share this medicine with others. What if I miss a dose? It is important not to miss your  dose. Call your doctor or health care professional if you are unable to keep an appointment. What may interact with this medicine? Do not take this medicine with any of the following medications: -other medicines containing denosumab This medicine may also interact with the following medications: -medicines that lower your chance of fighting infection -steroid medicines like prednisone or cortisone This list may not describe all possible interactions. Give your health care provider a list of all the medicines, herbs, non-prescription drugs, or dietary supplements you use. Also tell them if you smoke, drink alcohol, or use illegal drugs. Some items may interact with your medicine. What should I watch for while using this medicine? Visit your doctor or health care professional for regular checks on your progress. Your doctor or health care professional may order blood tests and other tests to see how you are doing. Call your doctor or health care professional for advice if you get a fever, chills or sore throat, or other symptoms of a cold or flu. Do not treat yourself. This drug may decrease your body's ability to fight infection. Try to avoid being around people who are sick. You should make sure you get enough calcium and vitamin D while you are taking this medicine, unless your doctor tells you not to. Discuss the foods you eat and the vitamins you take with your health care professional. See your dentist regularly. Brush and floss your teeth as directed. Before you have any dental work done, tell your dentist you are receiving this medicine. Do not become pregnant while taking this medicine or for 5 months after stopping  it. Talk with your doctor or health care professional about your birth control options while taking this medicine. Women should inform their doctor if they wish to become pregnant or think they might be pregnant. There is a potential for serious side effects to an unborn child. Talk  to your health care professional or pharmacist for more information. What side effects may I notice from receiving this medicine? Side effects that you should report to your doctor or health care professional as soon as possible: -allergic reactions like skin rash, itching or hives, swelling of the face, lips, or tongue -bone pain -breathing problems -dizziness -jaw pain, especially after dental work -redness, blistering, peeling of the skin -signs and symptoms of infection like fever or chills; cough; sore throat; pain or trouble passing urine -signs of low calcium like fast heartbeat, muscle cramps or muscle pain; pain, tingling, numbness in the hands or feet; seizures -unusual bleeding or bruising -unusually weak or tired Side effects that usually do not require medical attention (report to your doctor or health care professional if they continue or are bothersome): -constipation -diarrhea -headache -joint pain -loss of appetite -muscle pain -runny nose -tiredness -upset stomach This list may not describe all possible side effects. Call your doctor for medical advice about side effects. You may report side effects to FDA at 1-800-FDA-1088. Where should I keep my medicine? This medicine is only given in a clinic, doctor's office, or other health care setting and will not be stored at home. NOTE: This sheet is a summary. It may not cover all possible information. If you have questions about this medicine, talk to your doctor, pharmacist, or health care provider.  2018 Elsevier/Gold Standard (2016-02-10 19:17:21)   Osteoporosis Osteoporosis is the thinning and loss of density in the bones. Osteoporosis makes the bones more brittle, fragile, and likely to break (fracture). Over time, osteoporosis can cause the bones to become so weak that they fracture after a simple fall. The bones most likely to fracture are the bones in the hip, wrist, and spine. What are the causes? The exact  cause is not known. What increases the risk? Anyone can develop osteoporosis. You may be at greater risk if you have a family history of the condition or have poor nutrition. You may also have a higher risk if you are:  Female.  17 years old or older.  A smoker.  Not physically active.  White or Asian.  Slender.  What are the signs or symptoms? A fracture might be the first sign of the disease, especially if it results from a fall or injury that would not usually cause a bone to break. Other signs and symptoms include:  Low back and neck pain.  Stooped posture.  Height loss.  How is this diagnosed? To make a diagnosis, your health care provider may:  Take a medical history.  Perform a physical exam.  Order tests, such as: ? A bone mineral density test. ? A dual-energy X-ray absorptiometry test.  How is this treated? The goal of osteoporosis treatment is to strengthen your bones to reduce your risk of a fracture. Treatment may involve:  Making lifestyle changes, such as: ? Eating a diet rich in calcium. ? Doing weight-bearing and muscle-strengthening exercises. ? Stopping tobacco use. ? Limiting alcohol intake.  Taking medicine to slow the process of bone loss or to increase bone density.  Monitoring your levels of calcium and vitamin D.  Follow these instructions at home:  Include calcium and vitamin  D in your diet. Calcium is important for bone health, and vitamin D helps the body absorb calcium.  Perform weight-bearing and muscle-strengthening exercises as directed by your health care provider.  Do not use any tobacco products, including cigarettes, chewing tobacco, and electronic cigarettes. If you need help quitting, ask your health care provider.  Limit your alcohol intake.  Take medicines only as directed by your health care provider.  Keep all follow-up visits as directed by your health care provider. This is important.  Take precautions at home  to lower your risk of falling, such as: ? Keeping rooms well lit and clutter free. ? Installing safety rails on stairs. ? Using rubber mats in the bathroom and other areas that are often wet or slippery. Get help right away if: You fall or injure yourself. This information is not intended to replace advice given to you by your health care provider. Make sure you discuss any questions you have with your health care provider. Document Released: 10/28/2004 Document Revised: 06/23/2015 Document Reviewed: 06/28/2013 Elsevier Interactive Patient Education  2018 [employer]. Raloxifene tablets What is this medicine? RALOXIFENE (ral OX i feen) reduces the amount of calcium lost from bones. It is used to treat and prevent osteoporosis in women who have experienced menopause. It may also help prevent invasive breast cancer in certain women who have a high risk for breast cancer. This medicine may be used for other purposes; ask your health care provider or pharmacist if you have questions. COMMON BRAND NAME(S): Evista What should I tell my health care provider before I take this medicine? They need to know if you have any of these conditions: -a history of blood clots -cancer -heart disease or recent heart attack -high levels of triglycerides (blood fat) in the blood -history of stroke -kidney disease -liver disease -premenopausal -smoke tobacco -an unusual or allergic reaction to raloxifene, other medicines, foods, dyes, or preservatives -pregnant or trying to get pregnant -breast-feeding How should I use this medicine? Take this medicine by mouth with a glass of water. Follow the directions on the prescription label. The tablets can be taken with or without food. Take your doses at regular intervals. Do not take your medicine more often than directed. A special MedGuide will be given to you by the pharmacist with each prescription and refill. Be sure to read this information carefully each  time. Talk to your pediatrician regarding the use of this medicine in children. Special care may be needed. Overdosage: If you think you have taken too much of this medicine contact a poison control center or emergency room at once. NOTE: This medicine is only for you. Do not share this medicine with others. What if I miss a dose? If you miss a dose, take it as soon as you can. If it is almost time for your next dose, take only that dose. Do not take double or extra doses. What may interact with this medicine? -cholestyramine -female hormones, like estrogens -warfarin This list may not describe all possible interactions. Give your health care provider a list of all the medicines, herbs, non-prescription drugs, or dietary supplements you use. Also tell them if you smoke, drink alcohol, or use illegal drugs. Some items may interact with your medicine. What should I watch for while using this medicine? Visit your doctor or health care professional for regular checks on your progress. Do not stop taking this medicine except on the advice of your doctor or health care professional. If you are  taking this medicine to reduce your risk of getting breast cancer, you should know that this medicine does not prevent all types of breast cancer. Talk to your doctor if you have questions. This medicine does not prevent hot flashes. It may cause hot flashes in some patients at the start of therapy. You should make sure that you get enough calcium and vitamin D while you are taking this medicine. Discuss the foods you eat and the vitamins you take with your health care professional. Exercise may help to prevent bone loss. Discuss your exercise needs with your doctor or health care professional. This medicine can rarely cause blood clots. If you are going to have surgery, tell your doctor or health care professional that you are taking this medicine. This medicine should be stopped at least 3 days before surgery.  After surgery, it should be restarted only after you are walking again. It should not be restarted while you still need long periods of bed rest. You should not smoke while taking this medicine. Smoking may increase your risk of blood clots or stroke. If you have any reason to think you are pregnant; stop taking this medicine at once and contact your doctor or health care professional. Do not breast feed while taking this medicine. What side effects may I notice from receiving this medicine? Side effects that you should report to your doctor or health care professional as soon as possible: -allergic reactions like skin rash, itching or hives, swelling of the face, lips, or tongue) -breast tissue changes or discharge -signs and symptoms of a blood clot such as breathing problems; changes in vision; chest pain; severe, sudden headache; pain, swelling, warmth in the leg; trouble speaking; sudden numbness or weakness of the face, arm or leg -signs and symptoms of a stroke like changes in vision; confusion; trouble speaking or understanding; severe headaches; sudden numbness or weakness of the face, arm or leg; trouble walking; dizziness; loss of balance or coordination -vaginal discharge that is bloody, brown, or rust Side effects that usually do not require medical attention (report to your doctor or health care professional if they continue or are bothersome): -hot flashes -joint pain -leg cramps -sweating -swelling of the ankles, feet, hands This list may not describe all possible side effects. Call your doctor for medical advice about side effects. You may report side effects to FDA at 1-800-FDA-1088. Where should I keep my medicine? Keep out of the reach of children. Store at room temperature between 15 and 30 degrees C (59 and 86 degrees F). Throw away any unused medicine after the expiration date. NOTE: This sheet is a summary. It may not cover all possible information. If you have questions  about this medicine, talk to your doctor, pharmacist, or health care provider.  2018 Elsevier/Gold Standard (2016-02-25 17:15:34)

## 2017-07-14 LAB — PTH, INTACT AND CALCIUM
CALCIUM: 9.5 mg/dL (ref 8.7–10.3)
PTH: 34 pg/mL (ref 15–65)

## 2017-07-18 ENCOUNTER — Encounter: Payer: Self-pay | Admitting: Obstetrics and Gynecology

## 2017-07-22 ENCOUNTER — Encounter: Payer: Self-pay | Admitting: Family Medicine

## 2017-07-22 DIAGNOSIS — Z1211 Encounter for screening for malignant neoplasm of colon: Secondary | ICD-10-CM | POA: Diagnosis not present

## 2017-07-22 DIAGNOSIS — K642 Third degree hemorrhoids: Secondary | ICD-10-CM | POA: Diagnosis not present

## 2017-07-22 DIAGNOSIS — D124 Benign neoplasm of descending colon: Secondary | ICD-10-CM | POA: Diagnosis not present

## 2017-07-22 DIAGNOSIS — K635 Polyp of colon: Secondary | ICD-10-CM | POA: Diagnosis not present

## 2017-07-22 LAB — HM COLONOSCOPY

## 2017-07-25 HISTORY — PX: OTHER SURGICAL HISTORY: SHX169

## 2017-07-27 ENCOUNTER — Encounter: Payer: Self-pay | Admitting: Family Medicine

## 2017-12-03 LAB — GLUCOSE, POCT (MANUAL RESULT ENTRY): POC Glucose: 90 mg/dl (ref 70–99)

## 2017-12-09 IMAGING — CR DG FOOT COMPLETE 3+V*L*
3 series · 3 of 3 positions shown · non-contrast
Comparison: None.

CLINICAL DATA: Patient with mid foot pain after hyperextension
injury of the toes. Initial encounter.

EXAM:
LEFT FOOT - COMPLETE 3+ VIEW

[AP]
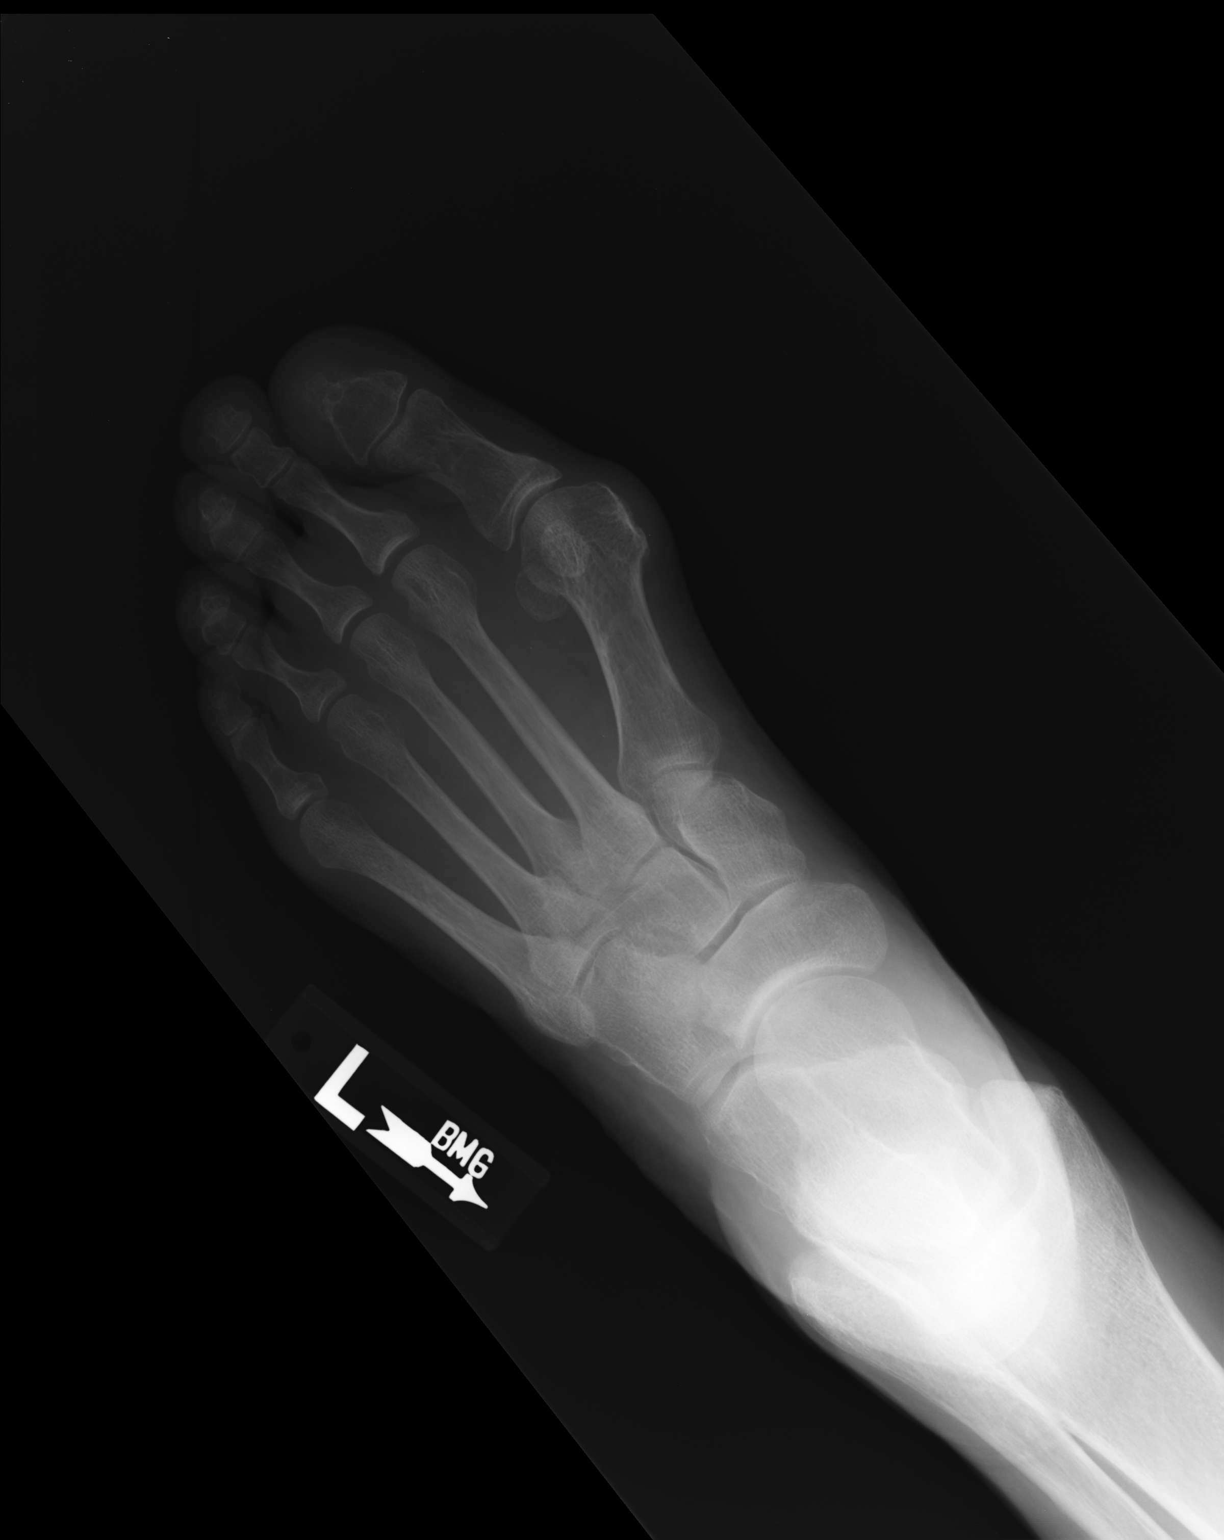

[ap obl int rot]
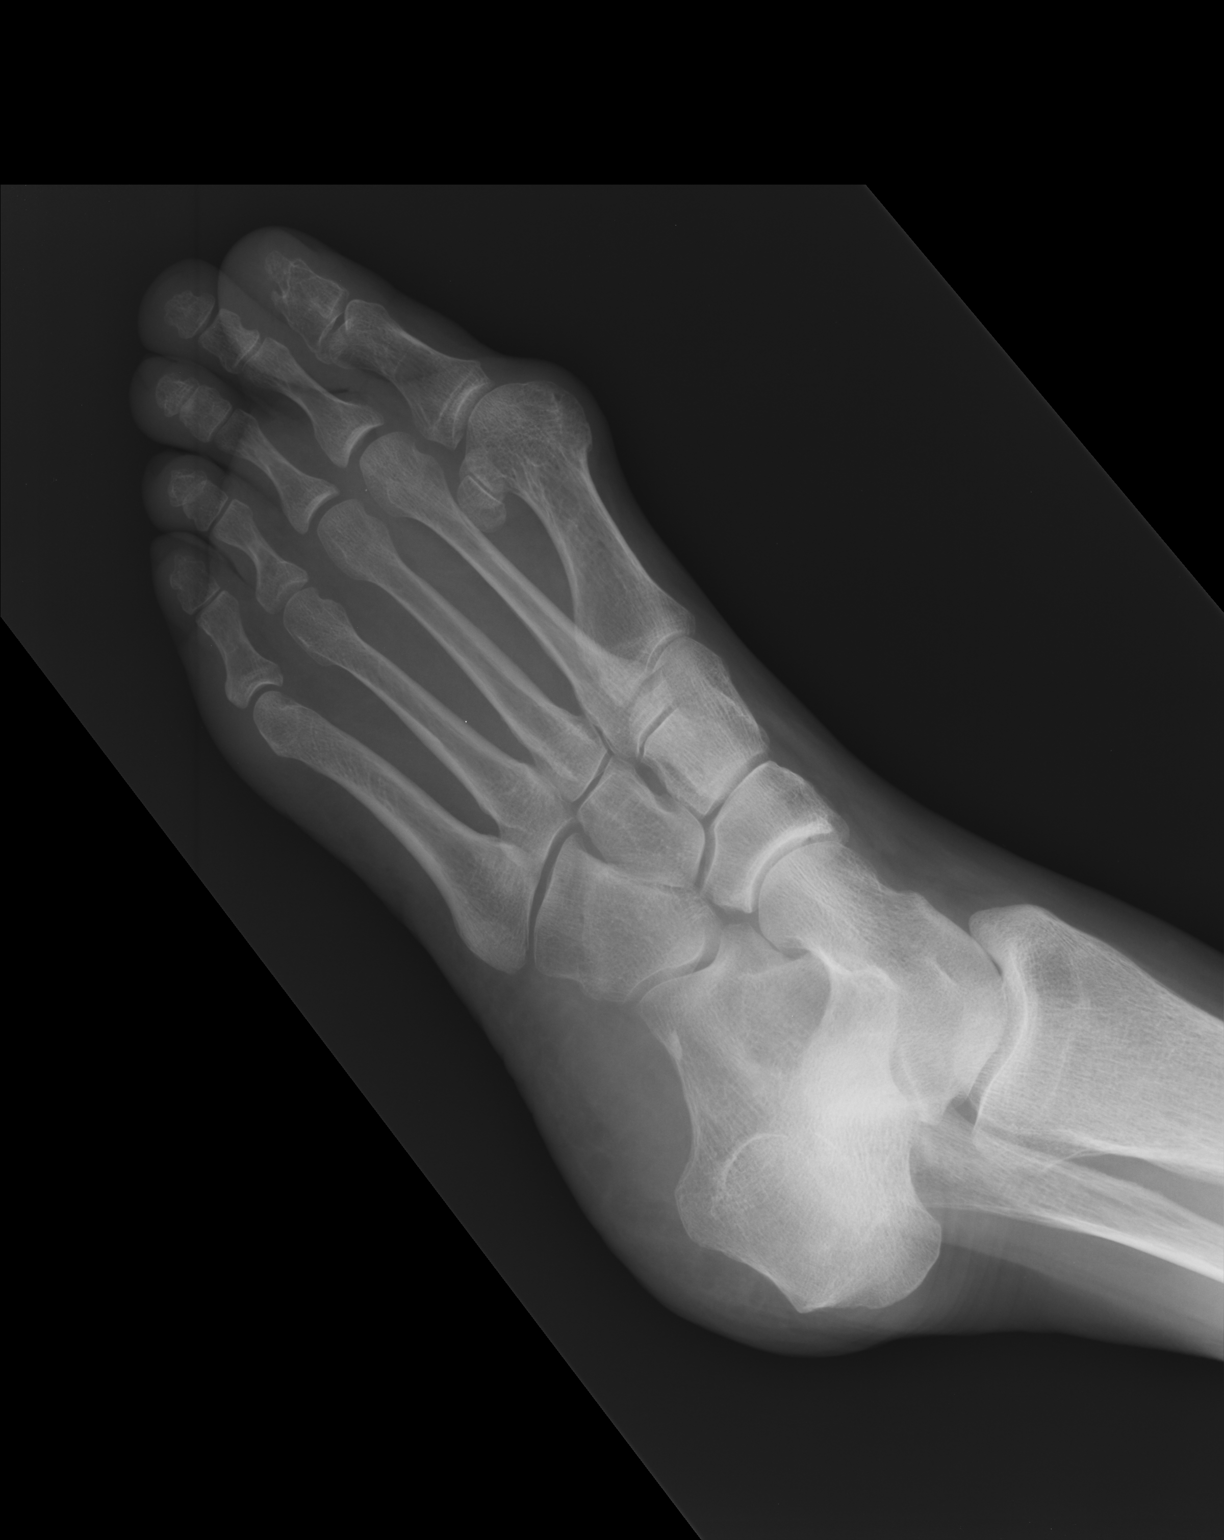

[lateral]
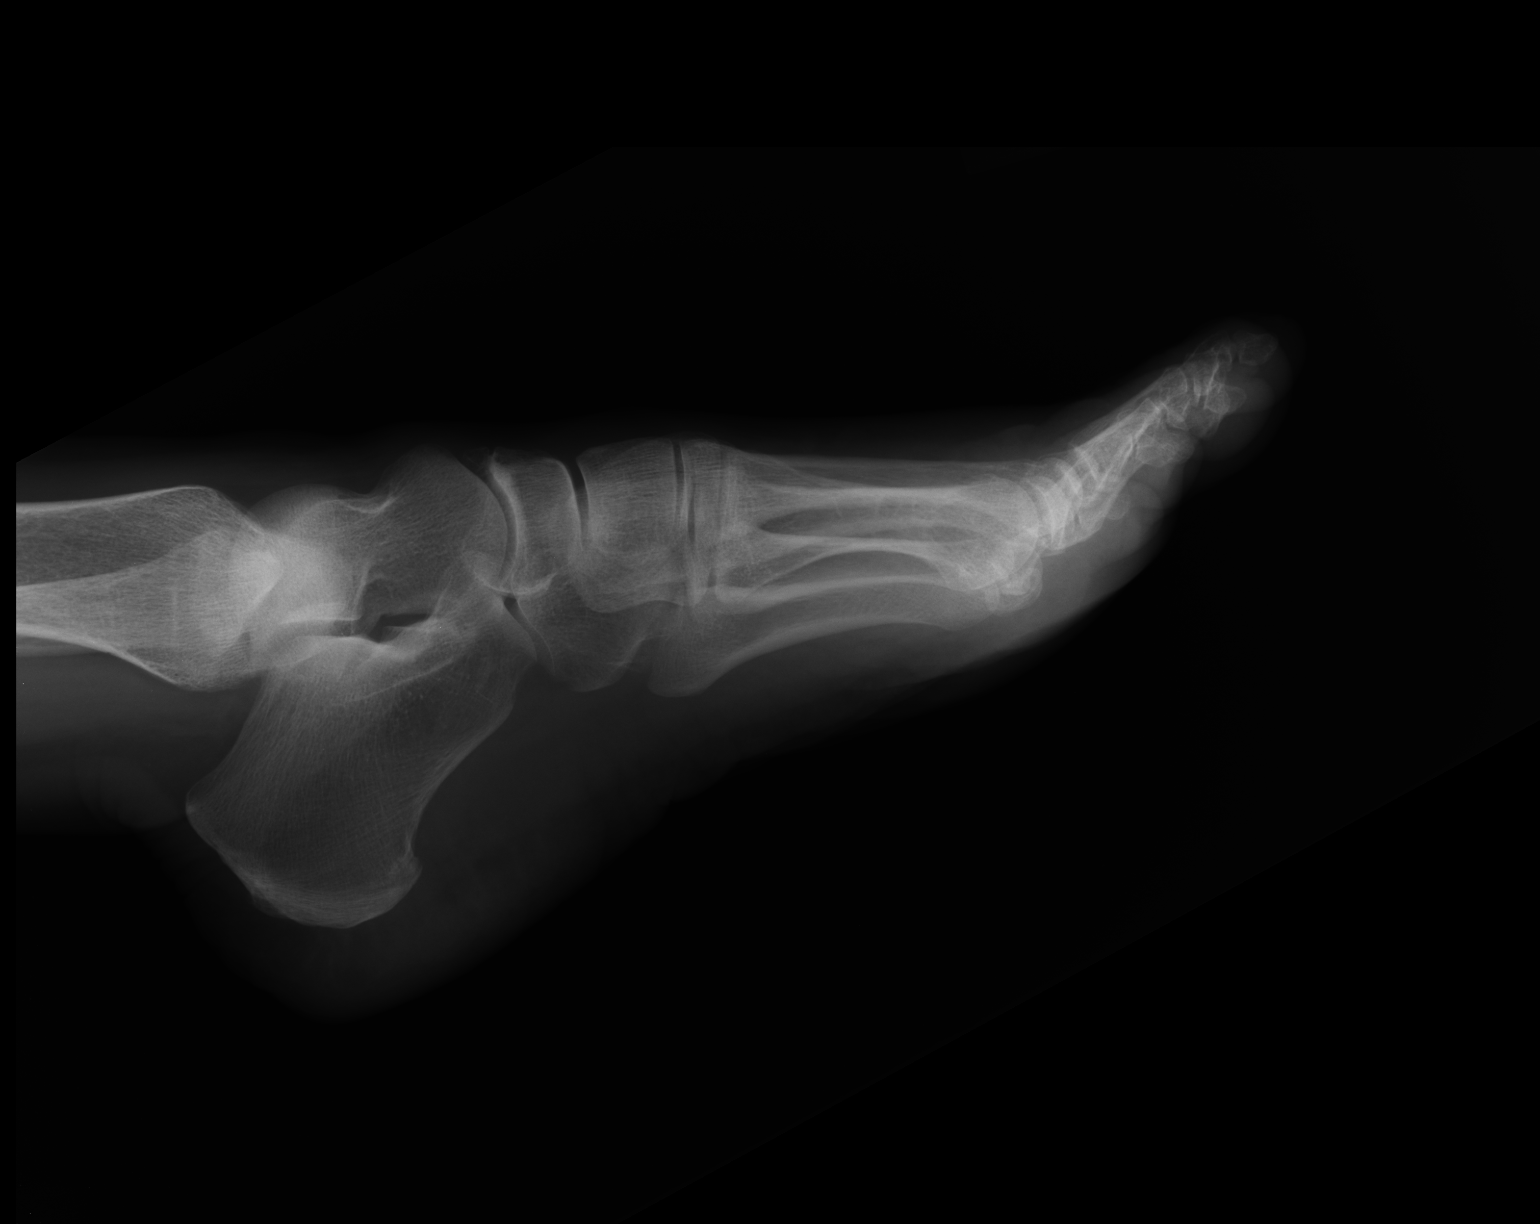

[3 of 3 positions shown; findings below may reference images not displayed]

FINDINGS: Hallux valgus deformity. No evidence for acute fracture or
dislocation. The tuft of the great toe distally is diminutive.
Regional soft tissues are unremarkable.
IMPRESSION: No acute osseous abnormality.

## 2018-01-03 ENCOUNTER — Encounter: Payer: Self-pay | Admitting: Family Medicine

## 2018-01-03 ENCOUNTER — Ambulatory Visit: Payer: Commercial Managed Care - PPO | Admitting: Family Medicine

## 2018-01-03 VITALS — BP 130/82 | HR 93 | Temp 98.1°F | Wt 129.4 lb

## 2018-01-03 DIAGNOSIS — J209 Acute bronchitis, unspecified: Secondary | ICD-10-CM

## 2018-01-03 DIAGNOSIS — Z961 Presence of intraocular lens: Secondary | ICD-10-CM | POA: Diagnosis not present

## 2018-01-03 DIAGNOSIS — H26492 Other secondary cataract, left eye: Secondary | ICD-10-CM | POA: Diagnosis not present

## 2018-01-03 DIAGNOSIS — H26493 Other secondary cataract, bilateral: Secondary | ICD-10-CM | POA: Diagnosis not present

## 2018-01-03 DIAGNOSIS — H26491 Other secondary cataract, right eye: Secondary | ICD-10-CM | POA: Diagnosis not present

## 2018-01-03 MED ORDER — AMOXICILLIN 875 MG PO TABS: 875.0000 mg | ORAL_TABLET | Freq: Two times a day (BID) | ORAL | 0 refills | Status: DC

## 2018-01-03 NOTE — Progress Notes (Signed)
   Subjective:    Patient ID: Laura Baker, female    DOB: 1955/10/02, 62 y.o.   MRN: [id number]  HPI She has a 10-day history of started with cough and rhinorrhea.  The symptoms continued and over the weekend coughing increased with associated headache, worsening rhinorrhea, slightly productive cough with chills and malaise.  She did use Sudafed for this.  She has no allergies and does not smoke.   Review of Systems     Objective:   Physical Exam Alert and in no distress. Tympanic membranes and canals are normal. Pharyngeal area is normal. Neck is supple without adenopathy or thyromegaly. Cardiac exam shows a regular sinus rhythm without murmurs or gallops. Lungs are clear to auscultation.        Assessment & Plan:  Acute bronchitis, unspecified organism - Plan: amoxicillin (AMOXIL) 875 MG tablet She is to call if not entirely better when she finishes the antibiotic.

## 2018-01-06 ENCOUNTER — Encounter: Payer: Self-pay | Admitting: Family Medicine

## 2018-01-17 DIAGNOSIS — H26491 Other secondary cataract, right eye: Secondary | ICD-10-CM | POA: Diagnosis not present

## 2018-01-17 DIAGNOSIS — H2511 Age-related nuclear cataract, right eye: Secondary | ICD-10-CM | POA: Diagnosis not present

## 2018-04-14 ENCOUNTER — Other Ambulatory Visit: Payer: Self-pay | Admitting: Emergency Medicine

## 2018-04-14 ENCOUNTER — Other Ambulatory Visit: Payer: Self-pay

## 2018-04-14 ENCOUNTER — Other Ambulatory Visit (INDEPENDENT_AMBULATORY_CARE_PROVIDER_SITE_OTHER): Payer: Commercial Managed Care - PPO

## 2018-04-14 DIAGNOSIS — Z0000 Encounter for general adult medical examination without abnormal findings: Secondary | ICD-10-CM

## 2018-04-17 LAB — COMPREHENSIVE METABOLIC PANEL
ALT: 16 IU/L (ref 0–32)
AST: 22 IU/L (ref 0–40)
Albumin/Globulin Ratio: 1.6 (ref 1.2–2.2)
Albumin: 4.4 g/dL (ref 3.8–4.8)
Alkaline Phosphatase: 73 IU/L (ref 39–117)
BUN/Creatinine Ratio: 19 (ref 12–28)
BUN: 16 mg/dL (ref 8–27)
Bilirubin Total: 0.7 mg/dL (ref 0.0–1.2)
CO2: 28 mmol/L (ref 20–29)
Calcium: 9.6 mg/dL (ref 8.7–10.3)
Chloride: 101 mmol/L (ref 96–106)
Creatinine, Ser: 0.86 mg/dL (ref 0.57–1.00)
GFR calc Af Amer: 83 mL/min/{1.73_m2} (ref >59)
GFR, EST NON AFRICAN AMERICAN: 72 mL/min/{1.73_m2} (ref >59)
GLUCOSE: 93 mg/dL (ref 65–99)
Globulin, Total: 2.8 g/dL (ref 1.5–4.5)
Potassium: 4.9 mmol/L (ref 3.5–5.2)
Sodium: 140 mmol/L (ref 134–144)
Total Protein: 7.2 g/dL (ref 6.0–8.5)

## 2018-04-17 LAB — CBC
Hematocrit: 43.8 % (ref 34.0–46.6)
Hemoglobin: 14.7 g/dL (ref 11.1–15.9)
MCH: 32.4 pg (ref 26.6–33.0)
MCHC: 33.6 g/dL (ref 31.5–35.7)
MCV: 97 fL (ref 79–97)
Platelets: 265 x10E3/uL (ref 150–450)
RBC: 4.54 x10E6/uL (ref 3.77–5.28)
RDW: 12.7 % (ref 11.7–15.4)
WBC: 5 x10E3/uL (ref 3.4–10.8)

## 2018-04-17 LAB — HEMOGLOBIN A1C
Est. average glucose Bld gHb Est-mCnc: 105 mg/dL
Hgb A1c MFr Bld: 5.3 % (ref 4.8–5.6)

## 2018-04-17 LAB — VITAMIN D 25 HYDROXY (VIT D DEFICIENCY, FRACTURES): Vit D, 25-Hydroxy: 24.3 ng/mL — ABNORMAL LOW (ref 30.0–100.0)

## 2018-04-17 LAB — TSH: TSH: 2.11 u[IU]/mL (ref 0.450–4.500)

## 2018-04-21 ENCOUNTER — Ambulatory Visit (INDEPENDENT_AMBULATORY_CARE_PROVIDER_SITE_OTHER): Payer: Commercial Managed Care - PPO | Admitting: Obstetrics and Gynecology

## 2018-04-21 ENCOUNTER — Ambulatory Visit: Payer: Commercial Managed Care - PPO | Admitting: Certified Nurse Midwife

## 2018-04-21 ENCOUNTER — Other Ambulatory Visit: Payer: Self-pay

## 2018-04-21 ENCOUNTER — Encounter: Payer: Self-pay | Admitting: Obstetrics and Gynecology

## 2018-04-21 VITALS — BP 128/80 | HR 80 | Resp 12 | Ht 62.0 in | Wt 125.0 lb

## 2018-04-21 DIAGNOSIS — Z01419 Encounter for gynecological examination (general) (routine) without abnormal findings: Secondary | ICD-10-CM | POA: Diagnosis not present

## 2018-04-21 DIAGNOSIS — Z23 Encounter for immunization: Secondary | ICD-10-CM

## 2018-04-21 MED ORDER — ESTRADIOL 0.1 MG/GM VA CREA: TOPICAL_CREAM | VAGINAL | 2 refills | Status: DC

## 2018-04-21 NOTE — Progress Notes (Signed)
63 y.o. [id number] Married Caucasian female here for annual exam.    Calcium servings are not consistent.  Taking one calcium with vit D added.  Using Premarin cream.  No pain with intercourse.  She is having periodontal problems so she does not want to treat her osteoporosis with prescription medication.   Had her labs done one week ago, and cholesterol was not included in these wellness labs.  PCP:  Dr. Miguel Rota   Patient's last menstrual period was 11/01/2008.           Sexually active: Yes.    The current method of family planning is status post hysterectomy.    Exercising: No.  The patient does not participate in regular exercise at present. Smoker:  no  Health Maintenance: Pap:  2010 Negative History of abnormal Pap:  no MMG:  06/29/17 BIRADS 1 negative/density c Colonoscopy:  07/22/17 polyps removed; f/u 5 years  BMD:   06/29/17  Result  Osteoporosis.  Not treated.  TDaP:  2010 Gardasil:   n/a HIV and Hep C: Neg 04/10/15 Screening Labs:  Discuss if needed   reports that she has never smoked. She has never used smokeless tobacco. She reports that she does not drink alcohol or use drugs.  Past Medical History:  Diagnosis Date  . Blood transfusion without reported diagnosis 2006  . Broken foot 05/2015   -right foot  . Cataract   . Low vitamin D level   . Osteopenia 2017   bone fracture April 2017.  Did not tolerate Actonel.  Do bone density in 2019.    Past Surgical History:  Procedure Laterality Date  . ABDOMINAL HYSTERECTOMY  11/2008  . ABDOMINAL SACROCOLPOPEXY    . CATARACT EXTRACTION Right 2008  . CATARACT EXTRACTION Left 05/2014  . CYST REMOVAL NECK  2002  . ROBOTIC ASSISTED LAPAROSCOPIC SACROCOLPOPEXY  06/2010   --L.[place]  . tubular adenoma N/A 07/25/2017    Current Outpatient Medications  Medication Sig Dispense Refill  . calcium citrate-vitamin D (CITRACAL+D) 315-200 MG-UNIT tablet Take 1 tablet by mouth 2 (two) times daily.    Marland Kitchen estradiol (ESTRACE  VAGINAL) 0.1 MG/GM vaginal cream Place 1/2 gram per vagina at bedtime 2 - 3 times per week. 42.5 g 2  . Multiple Vitamins-Minerals (CENTRUM SILVER ADULT 50+ PO) Take 1 tablet by mouth daily.     No current facility-administered medications for this visit.     Family History  Problem Relation Age of Onset  . Hypertension Mother   . Congestive Heart Failure Mother   . Thyroid disease Mother   . Osteoporosis Mother   . Stroke Mother   . Diabetes Mother   . Lung cancer Father   . Diabetes Sister   . Thyroid disease Sister   . Osteoporosis Sister   . Thyroid disease Brother   . Leukemia Sister   . Thyroid disease Sister   . Diabetes Sister   . Kidney disease Sister   . Scleroderma Sister   . Thyroid disease Brother   . Rashes / Skin problems Other     Review of Systems  Constitutional: Negative.   HENT: Negative.   Eyes: Negative.   Respiratory: Negative.   Cardiovascular: Negative.   Gastrointestinal: Negative.   Endocrine: Negative.   Genitourinary: Negative.   Musculoskeletal: Negative.   Skin: Negative.   Allergic/Immunologic: Negative.   Neurological: Negative.   Hematological: Negative.   Psychiatric/Behavioral: Negative.     Exam:   BP 128/80 (BP Location: Right Arm,  Patient Position: Sitting, Cuff Size: Normal)   Pulse 80   Resp 12   Ht 5\' 2"  (1.575 m)   Wt 125 lb (56.7 kg)   LMP 11/01/2008   BMI 22.86 kg/m     General appearance: alert, cooperative and appears stated age Head: Normocephalic, without obvious abnormality, atraumatic Neck: no adenopathy, supple, symmetrical, trachea midline and thyroid normal to inspection and palpation Lungs: clear to auscultation bilaterally Breasts: normal appearance, no masses or tenderness, No nipple retraction or dimpling, No nipple discharge or bleeding, No axillary or supraclavicular adenopathy Heart: regular rate and rhythm Abdomen: soft, non-tender; no masses, no organomegaly Extremities: extremities normal,  atraumatic, no cyanosis or edema Skin: Skin color, texture, turgor normal. No rashes or lesions Lymph nodes: Cervical, supraclavicular, and axillary nodes normal. No abnormal inguinal nodes palpated Neurologic: Grossly normal  Pelvic: External genitalia:  no lesions              Urethra:  normal appearing urethra with no masses, tenderness or lesions              Bartholins and Skenes: normal                 Vagina:  Vaginal apex with 1 cm area of mesh exposure - visible and palpable with bimanual exam.  First degree rectocele.              Cervix: absent              Pap taken: No. Bimanual Exam:  Uterus: absent              Adnexa: no mass, fullness, tenderness              Rectal exam: Yes.  .  Confirms.              Anus:  normal sphincter tone, hemorrhoids noted.   Chaperone was present for exam.  Assessment:   Well woman visit with normal exam. Status post abdominal hysterectomy. Ovaries remain.  Hx vaginal vault mesh erosion.Asymptomatic.  Has consulted with the pelvic surgeon who did her surgery and she is doing observational management now for years.  Low vit D.  Rectocele.  Constipation.  Hemorrhoids.  Osteoporosis.  Declines treatment.  Hx stress fracture.  Intolerance to Actonel.  Plan: Mammogram screening. Recommended self breast awareness. Pap and HR HPV as above. Guidelines for Calcium, Vitamin D, regular exercise program including cardiovascular and weight bearing exercise. Take TUMS one po bid.  Start Vit D 2000 IU daily.  Refill of Premarin vaginal cream.   Discussed effect on breast cancer. TDap. Lipid profile. Follow up annually and prn.  After visit summary provided.

## 2018-04-21 NOTE — Patient Instructions (Signed)

## 2018-04-22 LAB — LIPID PANEL
CHOLESTEROL TOTAL: 178 mg/dL (ref 100–199)
Chol/HDL Ratio: 3.2 ratio (ref 0.0–4.4)
HDL: 56 mg/dL (ref >39)
LDL Calculated: 100 mg/dL — ABNORMAL HIGH (ref 0–99)
Triglycerides: 110 mg/dL (ref 0–149)
VLDL Cholesterol Cal: 22 mg/dL (ref 5–40)

## 2018-04-25 ENCOUNTER — Telehealth: Payer: Self-pay | Admitting: *Deleted

## 2018-04-25 NOTE — Telephone Encounter (Signed)
Notes recorded by Burnice Logan, RN on 04/25/2018 at 3:15 PM EDT Left message to call Sharee Pimple, RN at [facility].   See telephone encounter dated 04/25/18

## 2018-04-25 NOTE — Telephone Encounter (Signed)
-----   Message from Nunzio Cobbs, MD sent at 04/22/2018  1:10 PM EDT ----- Please report cholesterol panel to patient.  This needs to be faxed to her work for her wellness requirements to be completed.

## 2018-04-26 NOTE — Telephone Encounter (Signed)
Spoke with patient. Results given. Patient verbalizes understanding. Requests results be faxed to [employer] at [phone]. Results faxed. Encounter closed.

## 2018-06-29 ENCOUNTER — Telehealth: Payer: Self-pay | Admitting: Obstetrics and Gynecology

## 2018-06-29 NOTE — Telephone Encounter (Signed)
Patient has a MMG appointment soon and is asking if Dr.Silva wants her to have the 3D MMG?

## 2018-06-29 NOTE — Telephone Encounter (Signed)
Left message to call Dorena Dorfman, RN at [facility] [phone].   

## 2018-06-30 NOTE — Telephone Encounter (Signed)
Spoke with patient. She is scheduled for a MMG at [facility] on 07/05/18. Patient asking if she needs a traditional or 3D MMG? Advised 3D MMG is recommended, advised patient is ultimately her decision if she chooses traditional or 3D. Patient states she needs a order for 3D screening MMG to [facility]. Advised patient order not required for screening MMG. Patient request RN confirm. Advised patient I will contact [facility] to confirm she is scheduled for 3D MMG and inquire about order. Patient agreeable.    Call placed to [facility], spoke with Verdis Frederickson. Was advised patient is scheduled for 3D MMG on 07/05/18, no order required.   Call returned to patient, advised as seen above. Advised patient she may want to check with her insurance provider if any questions regarding coverage of traditional vs 3D screening MMG. Patient verbalizes understanding and is agreeable.   Routing to provider for final review. Patient is agreeable to disposition. Will close encounter.

## 2018-07-05 LAB — HM MAMMOGRAPHY

## 2018-07-06 NOTE — Progress Notes (Signed)
Other

## 2019-02-02 DIAGNOSIS — R7309 Other abnormal glucose: Secondary | ICD-10-CM

## 2019-02-02 HISTORY — DX: Other abnormal glucose: R73.09

## 2019-04-04 DIAGNOSIS — Z0120 Encounter for dental examination and cleaning without abnormal findings: Secondary | ICD-10-CM | POA: Diagnosis not present

## 2019-04-18 ENCOUNTER — Other Ambulatory Visit (INDEPENDENT_AMBULATORY_CARE_PROVIDER_SITE_OTHER): Payer: Medicare Other

## 2019-04-18 ENCOUNTER — Other Ambulatory Visit: Payer: Self-pay

## 2019-04-18 ENCOUNTER — Telehealth: Payer: Self-pay | Admitting: *Deleted

## 2019-04-18 DIAGNOSIS — Z0000 Encounter for general adult medical examination without abnormal findings: Secondary | ICD-10-CM | POA: Diagnosis not present

## 2019-04-18 NOTE — Telephone Encounter (Signed)
Call to patient, line busy.

## 2019-04-18 NOTE — Telephone Encounter (Signed)
Patient is scheduled for a fasting lab appt today at 8:45am.   Patient is scheduled for AEX on 04/25/19 at 1pm.   Dr. Quincy Simmonds -please advise on lab orders.

## 2019-04-18 NOTE — Telephone Encounter (Signed)
Labs signed.   Encounter closed.

## 2019-04-18 NOTE — Telephone Encounter (Signed)
Spoke with patient to confirm. Labs are yearly AEX labs to include CBC, lipids, TSH, Vit D and hgbA1c and any additional recommendations from Dr. Quincy Simmonds, per patient. Patient is scheduled for AEX on 04/25/19.   Lab orders pended for CBC, CMET, Vit D, TSH, HgB A1c, lipid panel  Routing to Dr. Quincy Simmonds to review.

## 2019-04-18 NOTE — Telephone Encounter (Signed)
Please contact patient and ask here what labs she needs completed.  I want to be sure that we are able to meet her expectations.

## 2019-04-18 NOTE — Telephone Encounter (Signed)
[place] for these labs listed.

## 2019-04-19 LAB — COMPREHENSIVE METABOLIC PANEL
ALT: 25 IU/L (ref 0–32)
AST: 33 IU/L (ref 0–40)
Albumin/Globulin Ratio: 1.4 (ref 1.2–2.2)
Albumin: 4.4 g/dL (ref 3.8–4.8)
Alkaline Phosphatase: 77 IU/L (ref 39–117)
BUN/Creatinine Ratio: 13 (ref 12–28)
BUN: 11 mg/dL (ref 8–27)
Bilirubin Total: 0.6 mg/dL (ref 0.0–1.2)
CO2: 28 mmol/L (ref 20–29)
Calcium: 9.5 mg/dL (ref 8.7–10.3)
Chloride: 103 mmol/L (ref 96–106)
Creatinine, Ser: 0.86 mg/dL (ref 0.57–1.00)
GFR calc Af Amer: 82 mL/min/{1.73_m2} (ref >59)
GFR calc non Af Amer: 71 mL/min/{1.73_m2} (ref >59)
Globulin, Total: 3.1 g/dL (ref 1.5–4.5)
Glucose: 97 mg/dL (ref 65–99)
Potassium: 4.3 mmol/L (ref 3.5–5.2)
Sodium: 144 mmol/L (ref 134–144)
Total Protein: 7.5 g/dL (ref 6.0–8.5)

## 2019-04-19 LAB — VITAMIN D 25 HYDROXY (VIT D DEFICIENCY, FRACTURES): Vit D, 25-Hydroxy: 26.7 ng/mL — ABNORMAL LOW (ref 30.0–100.0)

## 2019-04-19 LAB — LIPID PANEL
Chol/HDL Ratio: 3.1 ratio (ref 0.0–4.4)
Cholesterol, Total: 193 mg/dL (ref 100–199)
HDL: 63 mg/dL (ref >39)
LDL Chol Calc (NIH): 105 mg/dL — ABNORMAL HIGH (ref 0–99)
Triglycerides: 142 mg/dL (ref 0–149)
VLDL Cholesterol Cal: 25 mg/dL (ref 5–40)

## 2019-04-19 LAB — CBC
Hematocrit: 44.1 % (ref 34.0–46.6)
Hemoglobin: 15 g/dL (ref 11.1–15.9)
MCH: 32.4 pg (ref 26.6–33.0)
MCHC: 34 g/dL (ref 31.5–35.7)
MCV: 95 fL (ref 79–97)
Platelets: 240 10*3/uL (ref 150–450)
RBC: 4.63 x10E6/uL (ref 3.77–5.28)
RDW: 12.5 % (ref 11.7–15.4)
WBC: 3.9 10*3/uL (ref 3.4–10.8)

## 2019-04-19 LAB — TSH: TSH: 2.73 u[IU]/mL (ref 0.450–4.500)

## 2019-04-19 LAB — HEMOGLOBIN A1C
Est. average glucose Bld gHb Est-mCnc: 117 mg/dL
Hgb A1c MFr Bld: 5.7 % — ABNORMAL HIGH (ref 4.8–5.6)

## 2019-04-24 NOTE — Progress Notes (Signed)
64 y.o. [id number] Married Caucasian female here for annual exam.    She had blood work done prior to her office visit.   Denies painful intercourse for patient or partner.   Has hemorrhoids.  Was referred to a surgeon for care, but this visit did not happen due to Covid pandemic. Her GI is Dr. Collene Mares.   She take calcium with vit D.  She also takes vit D separately.  She takes these every other day.   States members of family with leukemia:  Sister, niece and aunt.  Aunt has passed away.   PCP:  Jill Alexanders, MD  Patient's last menstrual period was 11/01/2008.           Sexually active: Yes.    The current method of family planning is status post hysterectomy.    Exercising: Yes.    walking and treadmill Smoker:  no  Health Maintenance: Pap: 2010 Neg History of abnormal Pap:  no MMG: 07-05-18 3D/Neg/density C/BiRads1 Colonoscopy: 07-22-17 polyp;next 07/2022 BMD: 06-29-17  Result :Osteoporosis. Declined treatment.  TDaP:  04-21-18 Gardasil:   no HIV: 04-10-15 Neg Hep C: 04-10-15 Neg Screening Labs:  Done.    reports that she has never smoked. She has never used smokeless tobacco. She reports that she does not drink alcohol or use drugs.  Past Medical History:  Diagnosis Date  . Blood transfusion without reported diagnosis 2006  . Broken foot 05/2015   -right foot  . Cataract   . Elevated hemoglobin A1c 2021  . Low vitamin D level   . Osteopenia 2017   bone fracture April 2017.  Did not tolerate Actonel.  Do bone density in 2019.    Past Surgical History:  Procedure Laterality Date  . ABDOMINAL HYSTERECTOMY  11/2008  . ABDOMINAL SACROCOLPOPEXY    . CATARACT EXTRACTION Right 2008  . CATARACT EXTRACTION Left 05/2014  . CYST REMOVAL NECK  2002  . ROBOTIC ASSISTED LAPAROSCOPIC SACROCOLPOPEXY  06/2010   --[place]  . tubular adenoma N/A 07/25/2017    Current Outpatient Medications  Medication Sig Dispense Refill  . calcium citrate-vitamin D (CITRACAL+D) 315-200 MG-UNIT  tablet Take 1 tablet by mouth 2 (two) times daily.    Marland Kitchen estradiol (ESTRACE VAGINAL) 0.1 MG/GM vaginal cream Place 1/2 gram per vagina at bedtime 2 - 3 times per week. 42.5 g 2  . Multiple Vitamins-Minerals (CENTRUM SILVER ADULT 50+ PO) Take 1 tablet by mouth daily.     No current facility-administered medications for this visit.    Family History  Problem Relation Age of Onset  . Hypertension Mother   . Congestive Heart Failure Mother   . Thyroid disease Mother   . Osteoporosis Mother   . Stroke Mother   . Diabetes Mother   . Lung cancer Father   . Diabetes Sister   . Thyroid disease Sister   . Osteoporosis Sister   . Thyroid disease Brother   . Leukemia Sister   . Thyroid disease Sister   . Diabetes Sister   . Kidney disease Sister   . Scleroderma Sister   . Thyroid disease Brother   . Rashes / Skin problems Other     Review of Systems  All other systems reviewed and are negative.   Exam:   BP 128/78   Pulse 76   Temp 97.6 F (36.4 C) (Temporal)   Resp 18   Ht 5' 1.5" (1.562 m)   Wt 131 lb (59.4 kg)   LMP 11/01/2008   BMI 24.35  kg/m     General appearance: alert, cooperative and appears stated age Head: normocephalic, without obvious abnormality, atraumatic Neck: no adenopathy, supple, symmetrical, trachea midline and thyroid normal to inspection and palpation Lungs: clear to auscultation bilaterally Breasts: normal appearance, no masses or tenderness, No nipple retraction or dimpling, No nipple discharge or bleeding, No axillary adenopathy Heart: regular rate and rhythm Abdomen: soft, non-tender; no masses, no organomegaly Extremities: extremities normal, atraumatic, no cyanosis or edema Skin: skin color, texture, turgor normal. No rashes or lesions Lymph nodes: cervical, supraclavicular, and axillary nodes normal. Neurologic: grossly normal  Pelvic: External genitalia:  no lesions              No abnormal inguinal nodes palpated.              Urethra:   normal appearing urethra with no masses, tenderness or lesions              Bartholins and Skenes: normal                 Vagina: normal appearing vagina with normal color and discharge, no lesions.  Minimal rectocele.               Cervix: erosion of mesh at vaginal apex.               Pap taken: No. Bimanual Exam:  Uterus:  Absent.               Adnexa: no mass, fullness, tenderness              Rectal exam: Yes.  .  Confirms.              Anus:  normal sphincter tone, ulcerated hemorrhoids.   Chaperone was present for exam.  Assessment:   Well woman visit with normal exam. Status post abdominal hysterectomy. Ovaries remain.  Hx vaginal vault mesh erosion.Asymptomatic.Has consulted with the pelvic surgeon who did her surgery and she is doing observational management now for years. Low vit D.  Rectocele.  Constipation.  Hemorrhoids. Osteoporosis. Declines treatment.  Hx stress fracture. Intolerance to Actonel. Elevated A1C.  Slightly low vit D.  Plan: Mammogram screening discussed. Self breast awareness reviewed. Pap and HR HPV as above. Guidelines for Calcium, Vitamin D, regular exercise program including cardiovascular and weight bearing exercise. [facility].  She will try to coordinate.  She declines refill of vaginal estrogen cream.  She will follow up with her GI for direction on her hemorrhoid care.  We discussed her elevated hemoglobin A1C and low vit D.  She will contact her PCP to restart regular routine care.  Follow up annually and prn.   After visit summary provided.

## 2019-04-25 ENCOUNTER — Encounter: Payer: Self-pay | Admitting: Certified Nurse Midwife

## 2019-04-25 ENCOUNTER — Ambulatory Visit (INDEPENDENT_AMBULATORY_CARE_PROVIDER_SITE_OTHER): Payer: Medicare Other | Admitting: Obstetrics and Gynecology

## 2019-04-25 ENCOUNTER — Encounter: Payer: Self-pay | Admitting: Obstetrics and Gynecology

## 2019-04-25 ENCOUNTER — Other Ambulatory Visit: Payer: Self-pay

## 2019-04-25 VITALS — BP 128/78 | HR 76 | Temp 97.6°F | Resp 18 | Ht 61.5 in | Wt 131.0 lb

## 2019-04-25 DIAGNOSIS — M810 Age-related osteoporosis without current pathological fracture: Secondary | ICD-10-CM

## 2019-04-25 DIAGNOSIS — Z01419 Encounter for gynecological examination (general) (routine) without abnormal findings: Secondary | ICD-10-CM | POA: Diagnosis not present

## 2019-04-25 NOTE — Patient Instructions (Signed)

## 2019-04-27 ENCOUNTER — Telehealth: Payer: Self-pay

## 2019-04-27 ENCOUNTER — Telehealth: Payer: Self-pay | Admitting: Obstetrics and Gynecology

## 2019-04-27 NOTE — Telephone Encounter (Signed)
Have her schedule an appointment

## 2019-04-27 NOTE — Telephone Encounter (Signed)
Order for BMD written and to [place] for review and signature before sending to [facility].

## 2019-04-27 NOTE — Telephone Encounter (Signed)
Patient is requesting an order for BMD sent to [facility] for her appointment 07/11/19.

## 2019-04-27 NOTE — Telephone Encounter (Signed)
Pt. Called stating she recently saw her OBGYN and they checked her A1C there and it was 5.7 so it was a little above the normal range so they told her to call her PCP and let him know her A1C was elevated. She also stated she has not been seen here since 2019.

## 2019-04-27 NOTE — Telephone Encounter (Signed)
Order received back signed by Dr Quincy Simmonds. Pt notified of order being faxed today and can make appointment at [facility].   Faxed orders to [place].   Routed to Dr Quincy Simmonds for final review and will close encounter.

## 2019-04-30 NOTE — Telephone Encounter (Signed)
Appt was made. KH 

## 2019-05-03 ENCOUNTER — Ambulatory Visit (INDEPENDENT_AMBULATORY_CARE_PROVIDER_SITE_OTHER): Payer: Medicare Other | Admitting: Family Medicine

## 2019-05-03 ENCOUNTER — Encounter: Payer: Self-pay | Admitting: Family Medicine

## 2019-05-03 ENCOUNTER — Other Ambulatory Visit: Payer: Self-pay

## 2019-05-03 VITALS — BP 110/70 | HR 84 | Temp 98.2°F | Wt 129.6 lb

## 2019-05-03 DIAGNOSIS — R7309 Other abnormal glucose: Secondary | ICD-10-CM | POA: Diagnosis not present

## 2019-05-03 NOTE — Progress Notes (Signed)
   Subjective:    Patient ID: Laura Baker, female    DOB: 1955/03/23, 64 y.o.   MRN: [id number]  HPI She is here for consult concerning her hemoglobin A1c.  1 year ago it was 5.3 and most recently it was 5.7.  Her gynecologist asked her to check with me concerning this.  She has no history of previous difficulty with this.  No family history of diabetes.   Review of Systems     Objective:   Physical Exam  Alert and in no distress otherwise not examined     Assessment & Plan:  Elevated hemoglobin A1c I discussed the elevated hemoglobin A1c with her.  At this point she is close to being considered glucose intolerant.  Explained that diet and exercise would be the main issue is here discussed cutting back on carbohydrates as well as 20 minutes of something physical or 150 minutes a week of something physical.  She will follow up with me in roughly 1 year for blood work.

## 2019-05-09 ENCOUNTER — Telehealth: Payer: Self-pay | Admitting: Obstetrics and Gynecology

## 2019-05-09 NOTE — Telephone Encounter (Signed)
Last BMD 06/29/17 at [facility] Osteoporosis, declines Tx Last AEX 04/25/19  [facility] BMD order to Dr. Quincy Simmonds for signature.

## 2019-05-09 NOTE — Telephone Encounter (Signed)
Order was [facility]ed over to the [facility] for [facility]. Patient is having done at [facility] along with her mammogram. Order will need to be faxed to [facility].

## 2019-05-09 NOTE — Telephone Encounter (Signed)
BMD order faxed to [facility].   Spoke with patient, advised as seen above. Patient is already scheduled for BMD. Patient verbalizes understanding and is agreeable.  Routing to provider for final review. Patient is agreeable to disposition. Will close encounter.

## 2019-06-27 DIAGNOSIS — H524 Presbyopia: Secondary | ICD-10-CM | POA: Diagnosis not present

## 2019-07-11 DIAGNOSIS — Z1231 Encounter for screening mammogram for malignant neoplasm of breast: Secondary | ICD-10-CM | POA: Diagnosis not present

## 2019-07-11 DIAGNOSIS — M810 Age-related osteoporosis without current pathological fracture: Secondary | ICD-10-CM | POA: Diagnosis not present

## 2019-07-11 DIAGNOSIS — M8589 Other specified disorders of bone density and structure, multiple sites: Secondary | ICD-10-CM | POA: Diagnosis not present

## 2019-07-11 LAB — HM DEXA SCAN

## 2019-07-11 LAB — HM MAMMOGRAPHY

## 2019-07-20 ENCOUNTER — Telehealth: Payer: Self-pay

## 2019-07-20 NOTE — Telephone Encounter (Signed)
LVM advise pt to call on Monday and schedule a virtual appt with Dr. Redmond School to discuss her bone density. [place]

## 2019-07-24 ENCOUNTER — Encounter: Payer: Self-pay | Admitting: Family Medicine

## 2019-07-24 ENCOUNTER — Telehealth: Payer: Self-pay | Admitting: Obstetrics and Gynecology

## 2019-07-24 NOTE — Telephone Encounter (Signed)
Left message with patients spouse, Shanon Brow, to call Sharee Pimple, RN at [facility].

## 2019-07-24 NOTE — Telephone Encounter (Signed)
Please contact patient regarding her bone density from [place].  She continues to have osteoporosis of her hip and her spine has osteopenia.   Is she going to follow up with her PCP?

## 2019-07-26 NOTE — Telephone Encounter (Signed)
Spoke with patient, advised per Dr. Quincy Simmonds. Patient is f/u with PCP, has an appt on 07/30/19 to further discuss results. Patient thankful for call.   Routing to provider for final review. Patient is agreeable to disposition. Will close encounter.

## 2019-07-26 NOTE — Telephone Encounter (Signed)
Patient is returning call.  °

## 2019-07-30 ENCOUNTER — Encounter: Payer: Self-pay | Admitting: Family Medicine

## 2019-07-30 ENCOUNTER — Telehealth (INDEPENDENT_AMBULATORY_CARE_PROVIDER_SITE_OTHER): Payer: Medicare Other | Admitting: Family Medicine

## 2019-07-30 ENCOUNTER — Other Ambulatory Visit: Payer: Self-pay

## 2019-07-30 VITALS — Temp 98.6°F | Wt 129.0 lb

## 2019-07-30 DIAGNOSIS — M810 Age-related osteoporosis without current pathological fracture: Secondary | ICD-10-CM | POA: Insufficient documentation

## 2019-07-30 NOTE — Progress Notes (Addendum)
   Subjective:    Patient ID: Laura Baker, female    DOB: [birth year], 64 y.o.   MRN: [id number]  HPI I connected with  Laura Baker on 07/30/19 by a video enabled telemedicine application and verified that I am speaking with the correct person using two identifiers.   I discussed the limitations of evaluation and management by telemedicine. The patient expressed understanding and agreed to proceed.  This was conducted by phone as she does not have access to computer.  She is at her home and I am in my office.  She has a history of osteoporosis and did try Actonel but had side effects from that medication and has been off any preps for the last several years.  She has been taking a multivitamin, calcium and vitamin D.  Her most recent DEXA scan showed a number of 2.5.   Review of Systems     Objective:   Physical Exam Alert and in no distress otherwise not examined       Assessment & Plan:  Osteoporosis without current pathological fracture, unspecified osteoporosis type - Failed bisphosphonate and does not want Prolia I discussed the treatment with Prolia.  She was offered that in the past but decided against it.  She is concerned over side effects.  I discussed the possible side effects and the benefits however she still is not interested in being placed on this medication.  We will continue to monitor this.

## 2019-08-01 ENCOUNTER — Telehealth: Payer: Self-pay

## 2019-08-01 MED ORDER — ESTRADIOL 0.1 MG/GM VA CREA: TOPICAL_CREAM | VAGINAL | 1 refills | Status: DC

## 2019-08-01 NOTE — Telephone Encounter (Signed)
Spoke with pt. Pt made aware of Rx sent to pharmacy on file.  Rx Estrace Vaginal cream sent # 42.5g, 1 RF per Dr Quincy Simmonds.   Routing to Dr Quincy Simmonds for review.  Encounter closed.

## 2019-08-01 NOTE — Telephone Encounter (Signed)
Spoke with pt. Pt states being out of Estrace vaginal cream. States was here in March for AEX and thought had enough refills, but went to pharmacy and Rx expired. Needs updated Rx called into pharmacy on file.   Med refill request: Estrace Vaginal Cream Last AEX: 04/25/19 Next AEX: 04/30/20 Last MMG (if hormonal med) 07/11/19= [facility], 1 Neg- [facility] Refill authorized: # 42.5 g , 2 RF pended if approved. Please advise.   Routing to Dr Quincy Simmonds.

## 2019-08-01 NOTE — Telephone Encounter (Signed)
Patient is returning call to Jill. °

## 2019-08-01 NOTE — Telephone Encounter (Signed)
Ok for refill of vaginal Estrace 1/2 gram pv at hs two to three times per week.  Disp:  42.5 grams, RF 1.

## 2019-10-10 DIAGNOSIS — Z0120 Encounter for dental examination and cleaning without abnormal findings: Secondary | ICD-10-CM | POA: Diagnosis not present

## 2020-02-28 ENCOUNTER — Ambulatory Visit (INDEPENDENT_AMBULATORY_CARE_PROVIDER_SITE_OTHER): Payer: Medicare Other | Admitting: Family Medicine

## 2020-02-28 ENCOUNTER — Other Ambulatory Visit: Payer: Self-pay

## 2020-02-28 ENCOUNTER — Encounter: Payer: Self-pay | Admitting: Family Medicine

## 2020-02-28 VITALS — BP 118/64 | HR 80 | Ht 62.0 in | Wt 134.4 lb

## 2020-02-28 DIAGNOSIS — M79671 Pain in right foot: Secondary | ICD-10-CM | POA: Diagnosis not present

## 2020-02-28 DIAGNOSIS — M810 Age-related osteoporosis without current pathological fracture: Secondary | ICD-10-CM | POA: Diagnosis not present

## 2020-02-28 DIAGNOSIS — S93491A Sprain of other ligament of right ankle, initial encounter: Secondary | ICD-10-CM | POA: Diagnosis not present

## 2020-02-28 NOTE — Progress Notes (Signed)
Chief Complaint  Patient presents with  . [place] at [employer] this morning and her right food hurts-feels like she rolled it.     Twisted her right foot (rolled ankle/foot) at the [employer] this morning.  Thinks she tripped over a small box.  She fell, no injury other than the pain at the right side of the foot.  She iced it, elevated it.  No significant swelling.  Able to bear weight and walk, but it hurts.  H/o L foot fracture (when she tripped in yard, fell). Has osteoporosis, declined Prolia, not currently on treatment.  PMH, PSH, SH reviewed  Outpatient Encounter Medications as of 02/28/2020  Medication Sig  . calcium citrate-vitamin D (CITRACAL+D) 315-200 MG-UNIT tablet Take 1 tablet by mouth 2 (two) times daily.  Marland Kitchen estradiol (ESTRACE VAGINAL) 0.1 MG/GM vaginal cream Place 1/2 gram per vagina at bedtime 2 - 3 times per week.  . Multiple Vitamins-Minerals (CENTRUM SILVER ADULT 50+ PO) Take 1 tablet by mouth daily.   No facility-administered encounter medications on file as of 02/28/2020.   Allergies  Allergen Reactions  . Actonel [Risedronate Sodium]     GI upset and dizziness.  . Sulfa Antibiotics Hives   ROS: no fever, chills, URI symptoms, shortness of breath.  R foot pain per HPI. No bleeding/bruising   PHYSICAL EXAM:  BP 118/64   Pulse 80   Ht 5\' 2"  (1.575 m)   Wt 134 lb 6.4 oz (61 kg)   LMP 11/01/2008   BMI 24.58 kg/m   Pleasant female, accompanied by her husband, in no distress RLE:  2+ pulses No edema. There is some soft tissue swelling and tenderness at the ATFL Tenderness extends to the proximal tarsal bone. No significant swelling or bruising noted of the foot   ASSESSMENT/PLAN:  Right foot pain - doubt fx, but hx in past with similar injury, pt w/osteoporosis.  Check x-ray if not improving quickly (or if needed re: claims) - Plan: DG Foot Complete Right  Sprain of anterior talofibular ligament of right ankle, initial encounter - Mild sprain,  with pain extending to proximal portion of foot. Conservative treatment (ice, compression, elevation, NSAID prn); x-ray if worsening  Osteoporosis without current pathological fracture, unspecified osteoporosis type - not currently on any treatment   Ankle sprain and contusion. Cannot r/o fracture, though doubt. Conservative measures. X-ray if not improving.

## 2020-02-28 NOTE — Patient Instructions (Signed)
I believe you have sprained your ankle, and may also have a contusion (bruise) to the bone in the foot.  I can't entirely rule out anything more significant (ie fracture). I recommend icing, elevation, and compression--use of an ACE wrap or other ankle brace when you are walking. Ibuprofen or Aleve may be helpful for pain and inflammation--take with food. If it bothers your stomach, switch to Tylenol.  If your pain and/or swelling is worsening, go to [facility] ([address]  You do not need an appointment.

## 2020-02-29 ENCOUNTER — Ambulatory Visit
Admission: RE | Admit: 2020-02-29 | Discharge: 2020-02-29 | Disposition: A | Discharge status: Home / Self Care | Payer: Medicare Other | Source: Ambulatory Visit | Attending: Family Medicine | Admitting: Family Medicine

## 2020-02-29 DIAGNOSIS — M79671 Pain in right foot: Secondary | ICD-10-CM

## 2020-02-29 DIAGNOSIS — S99921A Unspecified injury of right foot, initial encounter: Secondary | ICD-10-CM | POA: Diagnosis not present

## 2020-04-10 ENCOUNTER — Other Ambulatory Visit: Payer: Self-pay

## 2020-04-10 ENCOUNTER — Other Ambulatory Visit: Payer: Medicare Other

## 2020-04-18 ENCOUNTER — Encounter: Payer: Self-pay | Admitting: Family Medicine

## 2020-04-18 ENCOUNTER — Ambulatory Visit (INDEPENDENT_AMBULATORY_CARE_PROVIDER_SITE_OTHER): Payer: Medicare Other | Admitting: Family Medicine

## 2020-04-18 DIAGNOSIS — Z0000 Encounter for general adult medical examination without abnormal findings: Secondary | ICD-10-CM | POA: Diagnosis not present

## 2020-04-18 DIAGNOSIS — Z8349 Family history of other endocrine, nutritional and metabolic diseases: Secondary | ICD-10-CM

## 2020-04-18 DIAGNOSIS — M810 Age-related osteoporosis without current pathological fracture: Secondary | ICD-10-CM | POA: Diagnosis not present

## 2020-04-18 DIAGNOSIS — Z2821 Immunization not carried out because of patient refusal: Secondary | ICD-10-CM | POA: Diagnosis not present

## 2020-04-18 DIAGNOSIS — Z86010 Personal history of colonic polyps: Secondary | ICD-10-CM

## 2020-04-18 DIAGNOSIS — Z13220 Encounter for screening for lipoid disorders: Secondary | ICD-10-CM | POA: Diagnosis not present

## 2020-04-18 NOTE — Patient Instructions (Signed)
  Laura Baker , Thank you for taking time to come for your Medicare Wellness Visit. I appreciate your ongoing commitment to your health goals. Please review the following plan we discussed and let me know if I can assist you in the future.   These are the goals we discussed:   This is a list of the screening recommended for you and due dates:  Health Maintenance  Topic Date Due  . Pneumonia vaccines (1 of 2 - PCV13) Never done  . COVID-19 Vaccine (3 - Booster for Pfizer series) 02/23/2020  . Mammogram  07/10/2020  . Colon Cancer Screening  07/23/2027  . Tetanus Vaccine  04/20/2028  . Flu Shot  Completed  . DEXA scan (bone density measurement)  Completed  .  Hepatitis C: One time screening is recommended by Center for Disease Control  (CDC) for  adults born from [birth year] through 1966.   Completed  . HPV Vaccine  Aged Out

## 2020-04-18 NOTE — Progress Notes (Signed)
Laura Baker is a 65 y.o. female who presents for annual wellness visit and follow-up on chronic medical conditions.  She has no particular concerns or complaints.  Her immunizations were reviewed and she does need the Covid booster as well as pneumonia shots however she is not interested in getting them at this point.  She will have a mammogram this year.  She does have a history of colonic polyps and is scheduled on a 5-year basis.  She does have a history of osteoporosis and apparently has been tried on Boniva and did not tolerate that.  She was offered Prolia and is not interested in that either.  This was done by her gynecologist.  She is on HRT and also taking extra vitamin D and calcium.  There is also history of thyroid disease in her mother and apparently her brother also had thyroid cancer.  Otherwise she has no particular concerns or complaints.  Immunizations and Health Maintenance Immunization History  Administered Date(s) Administered  . Influenza,inj,Quad PF,6+ Mos 12/27/2012, 12/04/2016, 12/03/2017, 12/08/2019  . Influenza-Unspecified 01/29/2016, 12/08/2019  . PFIZER(Purple Top)SARS-COV-2 Vaccination 08/02/2019, 08/23/2019  . Tdap 02/02/2008, 04/21/2018   Health Maintenance Due  Topic Date Due  . PNA vac Low Risk Adult (1 of 2 - PCV13) Never done  . COVID-19 Vaccine (3 - Booster for Pfizer series) 02/23/2020     Last Pap smear:  Hysterectomy 11/2008 Last mammogram: 07/11/19 Last colonoscopy: 07/22/17 Last DEXA: 07/11/19 Dentist: Q six months  Ophtho: Q year Exercise: walking and treadmill 4 times a week    Other doctors caring for patient include: Dr. Quincy Baker GYN  Advanced directives: Does Patient Have a Medical Advance Directive?: No Would patient like information on creating a medical advance directive?: Yes (ED - Information included in AVS)  Depression screen:  See questionnaire below.  Depression screen [facility] 2/9 04/18/2020 07/30/2019 05/16/2015 11/30/2014  Decreased  Interest 0 0 0 0  Down, Depressed, Hopeless 0 0 0 0  PHQ - 2 Score 0 0 0 0    Fall Risk Screen: see questionnaire below. Fall Risk  04/18/2020  Falls in the past year? 1  Number falls in past yr: 0  Injury with Fall? 1  Risk for fall due to : Other (Comment)    ADL screen:  See questionnaire below Functional Status Survey: Is the patient deaf or have difficulty hearing?: No Does the patient have difficulty seeing, even when wearing glasses/contacts?: No Does the patient have difficulty concentrating, remembering, or making decisions?: No Does the patient have difficulty walking or climbing stairs?: No Does the patient have difficulty dressing or bathing?: No Does the patient have difficulty doing errands alone such as visiting a doctor's office or shopping?: No   Review of Systems Constitutional: -, -unexpected weight change, -anorexia, -fatigue Allergy: -sneezing, -itching, -congestion Dermatology: denies changing moles, rash, lumps ENT: -runny nose, -ear pain, -sore throat,  Cardiology:  -chest pain, -palpitations, -orthopnea, Respiratory: -cough, -shortness of breath, -dyspnea on exertion, -wheezing,  Gastroenterology: -abdominal pain, -nausea, -vomiting, -diarrhea, -constipation, -dysphagia Hematology: -bleeding or bruising problems Musculoskeletal: -arthralgias, -myalgias, -joint swelling, -back pain, - Ophthalmology: -vision changes,  Urology: -dysuria, -difficulty urinating,  -urinary frequency, -urgency, incontinence Neurology: -, -numbness, , -memory loss, -falls, -dizziness    PHYSICAL EXAM:  LMP 11/01/2008   General Appearance: Alert, cooperative, no distress, appears stated age Head: Normocephalic, without obvious abnormality, atraumatic Eyes: PERRL, conjunctiva/corneas clear, EOM's intact,  Ears: Normal TM's and external ear canals Nose: Nares normal, mucosa normal, no  drainage or sinus tenderness Throat: Lips, mucosa, and tongue normal; teeth and gums  normal Neck: Supple, no lymphadenopathy;  thyroid:  no enlargement/tenderness/nodules; no carotid bruit or JVD Lungs: Clear to auscultation bilaterally without wheezes, rales or ronchi; respirations unlabored Heart: Regular rate and rhythm, S1 and S2 normal, no murmur, rubor gallop Abdomen: Soft, non-tender, nondistended, normoactive bowel sounds,  no masses, no hepatosplenomegaly  Skin:  Skin color, texture, turgor normal, no rashes or lesions Lymph nodes: Cervical, supraclavicular, and axillary nodes normal Neurologic:  CNII-XII intact, normal strength, sensation and gait; reflexes 2+ and symmetric throughout Psych: Normal mood, affect, hygiene and grooming.  ASSESSMENT/PLAN: Routine general medical examination at a health care facility - Plan: CBC with Differential/Platelet, Comprehensive metabolic panel, Lipid panel  Osteoporosis without current pathological fracture, unspecified osteoporosis type - Plan: CBC with Differential/Platelet, Comprehensive metabolic panel, VITAMIN D 25 Hydroxy (Vit-D Deficiency, Fractures)  Family history of thyroid disease in mother - Plan: TSH  Screening for lipid disorders - Plan: Lipid panel  Immunization declined  History of colonic polyps Explained the need for her vaccinations and she declined that.  Also discussed osteoporosis and possible risk for fracture.  She will follow up with GI concerning her colonoscopy.  Discussed  healthy diet, including goals of calcium and vitamin D intake  Immunization recommendations discussed.  Colonoscopy recommendations reviewed   Medicare Attestation I have personally reviewed: The patient's medical and social history Their use of alcohol, tobacco or illicit drugs Their current medications and supplements The patient's functional ability including ADLs,fall risks, home safety risks, cognitive, and hearing and visual impairment Diet and physical activities Evidence for depression or mood disorders  The  patient's weight, height, and BMI have been recorded in the chart.  I have made referrals, counseling, and provided education to the patient based on review of the above and I have provided the patient with a written personalized care plan for preventive services.     Jill Alexanders, MD   04/18/2020

## 2020-04-19 LAB — COMPREHENSIVE METABOLIC PANEL
ALT: 17 IU/L (ref 0–32)
AST: 18 IU/L (ref 0–40)
Albumin/Globulin Ratio: 1.6 (ref 1.2–2.2)
Albumin: 4.5 g/dL (ref 3.8–4.8)
Alkaline Phosphatase: 84 IU/L (ref 44–121)
BUN/Creatinine Ratio: 14 (ref 12–28)
BUN: 13 mg/dL (ref 8–27)
Bilirubin Total: 0.7 mg/dL (ref 0.0–1.2)
CO2: 26 mmol/L (ref 20–29)
Calcium: 9.7 mg/dL (ref 8.7–10.3)
Chloride: 100 mmol/L (ref 96–106)
Creatinine, Ser: 0.91 mg/dL (ref 0.57–1.00)
Globulin, Total: 2.8 g/dL (ref 1.5–4.5)
Glucose: 94 mg/dL (ref 65–99)
Potassium: 4.8 mmol/L (ref 3.5–5.2)
Sodium: 140 mmol/L (ref 134–144)
Total Protein: 7.3 g/dL (ref 6.0–8.5)
eGFR: 70 mL/min/{1.73_m2} (ref >59)

## 2020-04-19 LAB — CBC WITH DIFFERENTIAL/PLATELET
Basophils Absolute: 0 10*3/uL (ref 0.0–0.2)
Basos: 1 %
EOS (ABSOLUTE): 0 10*3/uL (ref 0.0–0.4)
Eos: 1 %
Hematocrit: 44.1 % (ref 34.0–46.6)
Hemoglobin: 14.8 g/dL (ref 11.1–15.9)
Immature Grans (Abs): 0 10*3/uL (ref 0.0–0.1)
Immature Granulocytes: 0 %
Lymphocytes Absolute: 1.1 10*3/uL (ref 0.7–3.1)
Lymphs: 15 %
MCH: 31.6 pg (ref 26.6–33.0)
MCHC: 33.6 g/dL (ref 31.5–35.7)
MCV: 94 fL (ref 79–97)
Monocytes Absolute: 0.5 10*3/uL (ref 0.1–0.9)
Monocytes: 6 %
Neutrophils Absolute: 6.1 10*3/uL (ref 1.4–7.0)
Neutrophils: 77 %
Platelets: 245 10*3/uL (ref 150–450)
RBC: 4.68 x10E6/uL (ref 3.77–5.28)
RDW: 11.9 % (ref 11.7–15.4)
WBC: 7.8 10*3/uL (ref 3.4–10.8)

## 2020-04-19 LAB — LIPID PANEL
Chol/HDL Ratio: 3.2 ratio (ref 0.0–4.4)
Cholesterol, Total: 200 mg/dL — ABNORMAL HIGH (ref 100–199)
HDL: 62 mg/dL (ref >39)
LDL Chol Calc (NIH): 124 mg/dL — ABNORMAL HIGH (ref 0–99)
Triglycerides: 79 mg/dL (ref 0–149)
VLDL Cholesterol Cal: 14 mg/dL (ref 5–40)

## 2020-04-19 LAB — VITAMIN D 25 HYDROXY (VIT D DEFICIENCY, FRACTURES): Vit D, 25-Hydroxy: 25.1 ng/mL — ABNORMAL LOW (ref 30.0–100.0)

## 2020-04-19 LAB — TSH: TSH: 1.68 u[IU]/mL (ref 0.450–4.500)

## 2020-04-23 ENCOUNTER — Other Ambulatory Visit: Payer: Medicare Other

## 2020-04-30 ENCOUNTER — Other Ambulatory Visit (HOSPITAL_COMMUNITY)
Admission: RE | Admit: 2020-04-30 | Discharge: 2020-04-30 | Disposition: A | Discharge status: Home / Self Care | Payer: Medicare Other | Source: Ambulatory Visit | Attending: Obstetrics and Gynecology | Admitting: Obstetrics and Gynecology

## 2020-04-30 ENCOUNTER — Other Ambulatory Visit: Payer: Self-pay

## 2020-04-30 ENCOUNTER — Encounter: Payer: Self-pay | Admitting: Obstetrics and Gynecology

## 2020-04-30 ENCOUNTER — Ambulatory Visit (INDEPENDENT_AMBULATORY_CARE_PROVIDER_SITE_OTHER): Payer: Medicare Other | Admitting: Obstetrics and Gynecology

## 2020-04-30 VITALS — BP 132/74 | HR 86 | Ht 61.5 in | Wt 129.0 lb

## 2020-04-30 DIAGNOSIS — R7989 Other specified abnormal findings of blood chemistry: Secondary | ICD-10-CM

## 2020-04-30 DIAGNOSIS — T83711D Erosion of implanted vaginal mesh and other prosthetic materials to surrounding organ or tissue, subsequent encounter: Secondary | ICD-10-CM

## 2020-04-30 DIAGNOSIS — K649 Unspecified hemorrhoids: Secondary | ICD-10-CM | POA: Diagnosis not present

## 2020-04-30 DIAGNOSIS — Z01411 Encounter for gynecological examination (general) (routine) with abnormal findings: Secondary | ICD-10-CM

## 2020-04-30 DIAGNOSIS — Z01419 Encounter for gynecological examination (general) (routine) without abnormal findings: Secondary | ICD-10-CM | POA: Insufficient documentation

## 2020-04-30 DIAGNOSIS — Z1239 Encounter for other screening for malignant neoplasm of breast: Secondary | ICD-10-CM | POA: Diagnosis not present

## 2020-04-30 MED ORDER — ESTRADIOL 0.1 MG/GM VA CREA: TOPICAL_CREAM | VAGINAL | 2 refills | Status: DC

## 2020-04-30 NOTE — Patient Instructions (Signed)

## 2020-04-30 NOTE — Progress Notes (Signed)
65 y.o. G11P3003 Married Caucasian female here for annual exam.    Wearing a pad due to fluid leaking sometimes.  No odor.  No itching, burning, or bleeding from the vagina.  No pain.   Leaks a little with a cough or sneeze.  Not leaking a lot.  Bowel function is good.   No vaginal dryness or discomfort with sexual activity.   Received her Covid vaccine and not booster.   PCP: Jill Alexanders, MD    Patient's last menstrual period was 11/01/2008.           Sexually active: Yes.    The current method of family planning is status post hysterectomy.    Exercising: Yes.    treadmill and walking Smoker:  no  Health Maintenance: Pap: 2010 Neg History of abnormal Pap:  no MMG: 07-11-19 [id number] Colonoscopy:  2019 polyp;next 5 years BMD:  07-11-19  Result :Osteoporosis-Declined Txment TDaP:  04-21-18 Gardasil:   no HIV:04-10-15 Neg Hep C:04-10-15 Neg Screening Labs:  PCP.   reports that she has never smoked. She has never used smokeless tobacco. She reports that she does not drink alcohol and does not use drugs.  Past Medical History:  Diagnosis Date  . Blood transfusion without reported diagnosis 2006  . Broken foot 05/2015   -right foot  . Cataract   . Elevated hemoglobin A1c 2021  . Low vitamin D level   . Osteopenia 2017   bone fracture April 2017.  Did not tolerate Actonel.  Do bone density in 2019.    Past Surgical History:  Procedure Laterality Date  . ABDOMINAL HYSTERECTOMY  11/2008  . ABDOMINAL SACROCOLPOPEXY    . CATARACT EXTRACTION Right 2008  . CATARACT EXTRACTION Left 05/2014  . CYST REMOVAL NECK  2002  . ROBOTIC ASSISTED LAPAROSCOPIC SACROCOLPOPEXY  06/2010   --[place]  . tubular adenoma N/A 07/25/2017    Current Outpatient Medications  Medication Sig Dispense Refill  . Acetaminophen (TYLENOL) 325 MG CAPS     . calcium carbonate (TUMS - DOSED IN MG ELEMENTAL CALCIUM) 500 MG chewable tablet     . calcium citrate-vitamin D (CITRACAL+D) 315-200 MG-UNIT  tablet Take 1 tablet by mouth 2 (two) times daily.    . Cholecalciferol (VITAMIN D3 PO)     . estradiol (ESTRACE VAGINAL) 0.1 MG/GM vaginal cream Place 1/2 gram per vagina at bedtime 2 - 3 times per week. 42.5 g 1  . Multiple Vitamins-Minerals (CENTRUM SILVER ADULT 50+ PO) Take 1 tablet by mouth daily.     No current facility-administered medications for this visit.    Family History  Problem Relation Age of Onset  . Hypertension Mother   . Congestive Heart Failure Mother   . Thyroid disease Mother   . Osteoporosis Mother   . Stroke Mother   . Diabetes Mother   . Lung cancer Father   . Diabetes Sister   . Thyroid disease Sister   . Osteoporosis Sister   . Thyroid disease Brother   . Leukemia Sister   . Thyroid disease Sister   . Diabetes Sister   . Kidney disease Sister   . Scleroderma Sister   . Thyroid disease Brother   . Rashes / Skin problems Other     Review of Systems  All other systems reviewed and are negative.   Exam:   BP 132/74   Pulse 86   Ht 5' 1.5" (1.562 m)   Wt 129 lb (58.5 kg)   LMP 11/01/2008   SpO2  98%   BMI 23.98 kg/m     General appearance: alert, cooperative and appears stated age Head: normocephalic, without obvious abnormality, atraumatic Neck: no adenopathy, supple, symmetrical, trachea midline and thyroid normal to inspection and palpation Lungs: clear to auscultation bilaterally Breasts: normal appearance, no masses or tenderness, No nipple retraction or dimpling, No nipple discharge or bleeding, No axillary adenopathy Heart: regular rate and rhythm Abdomen: soft, non-tender; no masses, no organomegaly Extremities: extremities normal, atraumatic, no cyanosis or edema Skin: skin color, texture, turgor normal. No rashes or lesions Lymph nodes: cervical, supraclavicular, and axillary nodes normal. Neurologic: grossly normal  Pelvic: External genitalia:  no lesions              No abnormal inguinal nodes palpated.              Urethra:   normal appearing urethra with no masses, tenderness or lesions              Bartholins and Skenes: normal                 Vagina: normal appearing vagina with normal color and discharge, mesh erosion at vaginal apex, first degree rectocele.              Cervix: absent              Pap taken: Yes.   Bimanual Exam:  Uterus:  absent              Adnexa: no mass, fullness, tenderness              Rectal exam: Yes.    Ulceration of hemorrhoids.                Anus:  normal sphincter tone.  Chaperone was present for exam.  Assessment:     Status post abdominal hysterectomy. Ovaries remain.  Pelvic exam with abnormal finding present.  Hx vaginal vault mesh erosion.Asymptomatic.Has consulted with the pelvic surgeon who did her surgery and she is doing observational management now for years. Stress incontinence.  Mild.  Rectocele.  Hemorrhoids. Osteoporosis.Declines treatment.Hx stress fracture. Intolerance to Actonel. Hx elevated A1C.  Slightly low vit D.  Plan:  Mammogram screening discussed. Self breast awareness reviewed. Pap and HR HPV reflex testing.  Guidelines for Calcium, Vitamin D, regular exercise program including cardiovascular and weight bearing exercise. Increase vit D to 2000 IU daily. Rx for vaginal estrogen.  I discussed potential effect on breast cancer. Referral to general surgery. Follow up annually and prn.

## 2020-05-02 LAB — CYTOLOGY - PAP: Diagnosis: NEGATIVE

## 2020-07-15 ENCOUNTER — Ambulatory Visit: Payer: Self-pay | Admitting: General Surgery

## 2020-07-15 DIAGNOSIS — K642 Third degree hemorrhoids: Secondary | ICD-10-CM | POA: Diagnosis not present

## 2020-07-15 NOTE — H&P (Signed)
The patient is a 65 year old female who presents with hemorrhoids. 65 year old female who presents to the office for evaluation of hemorrhoid disease.  She is states that over the past 2 years.  She is had worsening trouble with bleeding and drainage from her hemorrhoids.  She had a colonoscopy approximately 2 years ago which only showed 1 polyp.  She reports difficulty with incomplete evacuation, but denies any recent history of constipation.  She has never had any hemorrhoid procedures in the past.   Past Surgical History Janeann Forehand, CNA; 07/15/2020 10:32 AM) Cataract Surgery  Bilateral. Hysterectomy (not due to cancer) - Partial   Diagnostic Studies History Janeann Forehand, CNA; 07/15/2020 10:32 AM) Colonoscopy  1-5 years ago Mammogram  1-3 years ago  Allergies Janeann Forehand, CNA; 07/15/2020 10:34 AM) Actonel *ENDOCRINE AND METABOLIC AGENTS - MISC.*  Risedronate Sodium *ENDOCRINE AND METABOLIC AGENTS - MISC.*  Sulfa 10 *OPHTHALMIC AGENTS*  Allergies Reconciled   Medication History Janeann Forehand, CNA; 07/15/2020 10:34 AM) Estradiol  (0.1MG /GM Cream, Vaginal) Active. Medications Reconciled   Social History Janeann Forehand, CNA; 07/15/2020 10:32 AM) Caffeine use  Coffee. No alcohol use  No drug use  Tobacco use  Never smoker.  Family History Janeann Forehand, CNA; 07/15/2020 10:32 AM) Arthritis  Mother. Cancer  Father. Cerebrovascular Accident  Mother. Diabetes Mellitus  Mother, Sister. Heart Disease  Mother. Heart disease in female family member before age 18  Respiratory Condition  Father. Thyroid problems  Brother, Mother.  Pregnancy / Birth History Janeann Forehand, CNA; 07/15/2020 10:32 AM) Age at menarche  51 years. Age of menopause  88-55 Gravida  3 Maternal age  64-35 Para  67  Other Problems Janeann Forehand, CNA; 07/15/2020 10:32 AM) Hemorrhoids      Review of Systems Janeann Forehand CNA; 07/15/2020 10:32 AM) General Not Present- Appetite Loss,  Chills, Fatigue, Fever, Night Sweats, Weight Gain and Weight Loss. Skin Not Present- Change in Wart/Mole, Dryness, Hives, Jaundice, New Lesions, Non-Healing Wounds, Rash and Ulcer. HEENT Present- Wears glasses/contact lenses. Not Present- Earache, Hearing Loss, Hoarseness, Nose Bleed, Oral Ulcers, Ringing in the Ears, Seasonal Allergies, Sinus Pain, Sore Throat, Visual Disturbances and Yellow Eyes. Respiratory Not Present- Bloody sputum, Chronic Cough, Difficulty Breathing, Snoring and Wheezing. Breast Not Present- Breast Mass, Breast Pain, Nipple Discharge and Skin Changes. Cardiovascular Not Present- Chest Pain, Difficulty Breathing Lying Down, Leg Cramps, Palpitations, Rapid Heart Rate, Shortness of Breath and Swelling of Extremities. Gastrointestinal Present- Change in Bowel Habits and Hemorrhoids. Not Present- Abdominal Pain, Bloating, Bloody Stool, Chronic diarrhea, Constipation, Difficulty Swallowing, Excessive gas, Gets full quickly at meals, Indigestion, Nausea, Rectal Pain and Vomiting. Female Genitourinary Present- Nocturia. Not Present- Frequency, Painful Urination, Pelvic Pain and Urgency. Musculoskeletal Not Present- Back Pain, Joint Pain, Joint Stiffness, Muscle Pain, Muscle Weakness and Swelling of Extremities. Neurological Not Present- Decreased Memory, Fainting, Headaches, Numbness, Seizures, Tingling, Tremor, Trouble walking and Weakness. Psychiatric Not Present- Anxiety, Bipolar, Change in Sleep Pattern, Depression, Fearful and Frequent crying. Endocrine Not Present- Cold Intolerance, Excessive Hunger, Hair Changes, Heat Intolerance, Hot flashes and New Diabetes. Hematology Not Present- Blood Thinners, Easy Bruising, Excessive bleeding, Gland problems, HIV and Persistent Infections.  Vitals Adriana Reams Alston CNA; 07/15/2020 10:36 AM) 07/15/2020 10:34 AM Weight: 133.25 lb   Height: 62 in  Body Surface Area: 1.61 m   Body Mass Index: 24.37 kg/m   Temp.: 97.3 F    Pulse: 11  (Regular)    P.OX: 97% (Room air) BP: 160/80(Sitting, Left Arm, Standard)  Physical Exam Leighton Ruff MD; 4/73/4037 10:54 AM)  General Mental Status - Alert. General Appearance - Cooperative.  Rectal Anorectal Exam External - skin tag (small). Note: Grade 3. Moderately difficult to reduce. Internal - normal sphincter tone.    Assessment & Plan Leighton Ruff MD; 0/96/4383 10:56 AM)  PROLAPSED INTERNAL HEMORRHOIDS, GRADE 3 (K18.4) Impression: 65 year old female with right anterior, right posterior grade 3 hemorrhoids. We discussed transanal hemorrhoidal dearterialization versus standard hemorrhoidectomy. I think standard hemorrhoidectomy would be the best role of treatment for her given the chronic nature of her prolapse. We have discussed the significant amount of pain and required 2 and her after surgery. We discussed ways to manage his pain better home. We discussed the risk of bleeding. We discussed the risk of recurrence. All questions were answered. Patient would like to proceed with surgery in August.

## 2020-07-16 DIAGNOSIS — Z1231 Encounter for screening mammogram for malignant neoplasm of breast: Secondary | ICD-10-CM | POA: Diagnosis not present

## 2020-07-16 LAB — HM MAMMOGRAPHY

## 2020-07-18 ENCOUNTER — Encounter: Payer: Self-pay | Admitting: Family Medicine

## 2020-08-21 ENCOUNTER — Other Ambulatory Visit: Payer: Self-pay

## 2020-08-21 ENCOUNTER — Encounter (HOSPITAL_BASED_OUTPATIENT_CLINIC_OR_DEPARTMENT_OTHER): Payer: Self-pay | Admitting: General Surgery

## 2020-08-21 NOTE — Progress Notes (Signed)
Spoke w/ via phone for pre-op interview--- Pt Lab needs dos---- no              Lab results------ no COVID test -----patient states asymptomatic no test needed Arrive at -------  1015 on 08-29-2020 NPO after MN NO Solid Food.  Clear liquids from MN until--- 0915 Med rec completed Medications to take morning of surgery ----- NONE Diabetic medication ----- n/a Patient instructed no nail polish to be worn day of surgery Patient instructed to bring photo id and insurance card day of surgery Patient aware to have Driver (ride ) / caregiver for 24 hours after surgery -- husband, Laura Baker Patient Special Instructions ----- n/a Pre-Op special Istructions ----- n/a Patient verbalized understanding of instructions that were given at this phone interview. Patient denies shortness of breath, chest pain, fever, cough at this phone interview.

## 2020-08-28 MED ORDER — BUPIVACAINE LIPOSOME 1.3 % IJ SUSP: 20.0000 mL | Freq: Once | INTRAMUSCULAR | Status: DC

## 2020-08-29 ENCOUNTER — Encounter (HOSPITAL_BASED_OUTPATIENT_CLINIC_OR_DEPARTMENT_OTHER): Payer: Self-pay | Admitting: General Surgery

## 2020-08-29 ENCOUNTER — Encounter (HOSPITAL_BASED_OUTPATIENT_CLINIC_OR_DEPARTMENT_OTHER)
Admission: RE | Disposition: A | Discharge status: Home / Self Care | Payer: Self-pay | Source: Home / Self Care | Attending: General Surgery

## 2020-08-29 ENCOUNTER — Ambulatory Visit (HOSPITAL_BASED_OUTPATIENT_CLINIC_OR_DEPARTMENT_OTHER): Payer: Medicare Other | Admitting: Anesthesiology

## 2020-08-29 ENCOUNTER — Ambulatory Visit (HOSPITAL_BASED_OUTPATIENT_CLINIC_OR_DEPARTMENT_OTHER)
Admission: RE | Admit: 2020-08-29 | Discharge: 2020-08-29 | Disposition: A | Discharge status: Home / Self Care | Payer: Medicare Other | Attending: General Surgery | Admitting: General Surgery

## 2020-08-29 DIAGNOSIS — K642 Third degree hemorrhoids: Secondary | ICD-10-CM | POA: Insufficient documentation

## 2020-08-29 DIAGNOSIS — K219 Gastro-esophageal reflux disease without esophagitis: Secondary | ICD-10-CM | POA: Diagnosis not present

## 2020-08-29 DIAGNOSIS — Z888 Allergy status to other drugs, medicaments and biological substances status: Secondary | ICD-10-CM | POA: Diagnosis not present

## 2020-08-29 DIAGNOSIS — M810 Age-related osteoporosis without current pathological fracture: Secondary | ICD-10-CM | POA: Diagnosis not present

## 2020-08-29 DIAGNOSIS — E559 Vitamin D deficiency, unspecified: Secondary | ICD-10-CM | POA: Diagnosis not present

## 2020-08-29 HISTORY — DX: Vitamin D deficiency, unspecified: E55.9

## 2020-08-29 HISTORY — PX: HEMORRHOID SURGERY: SHX153

## 2020-08-29 HISTORY — DX: Personal history of colonic polyps: Z86.010

## 2020-08-29 HISTORY — DX: Personal history of adenomatous and serrated colon polyps: Z86.0101

## 2020-08-29 HISTORY — DX: Nocturia: R35.1

## 2020-08-29 HISTORY — DX: Gastro-esophageal reflux disease without esophagitis: K21.9

## 2020-08-29 HISTORY — DX: Third degree hemorrhoids: K64.2

## 2020-08-29 SURGERY — HEMORRHOIDECTOMY
Anesthesia: Monitor Anesthesia Care | Site: Rectum

## 2020-08-29 MED ORDER — MIDAZOLAM HCL 5 MG/5ML IJ SOLN
INTRAMUSCULAR | Status: DC | PRN
  Administered 2020-08-29: 2 mg via INTRAVENOUS

## 2020-08-29 MED ORDER — ACETAMINOPHEN 500 MG PO TABS
ORAL_TABLET | ORAL | Status: AC
  Filled 2020-08-29: qty 2

## 2020-08-29 MED ORDER — MIDAZOLAM HCL 2 MG/2ML IJ SOLN
INTRAMUSCULAR | Status: AC
  Filled 2020-08-29: qty 2

## 2020-08-29 MED ORDER — EPHEDRINE SULFATE-NACL 50-0.9 MG/10ML-% IV SOSY
PREFILLED_SYRINGE | INTRAVENOUS | Status: DC | PRN
  Administered 2020-08-29: 10 mg via INTRAVENOUS

## 2020-08-29 MED ORDER — PROPOFOL 500 MG/50ML IV EMUL
INTRAVENOUS | Status: DC | PRN
  Administered 2020-08-29: 200 ug/kg/min via INTRAVENOUS

## 2020-08-29 MED ORDER — GLYCOPYRROLATE PF 0.2 MG/ML IJ SOSY
PREFILLED_SYRINGE | INTRAMUSCULAR | Status: AC
  Filled 2020-08-29: qty 1

## 2020-08-29 MED ORDER — PROPOFOL 10 MG/ML IV BOLUS
INTRAVENOUS | Status: AC
  Filled 2020-08-29: qty 20

## 2020-08-29 MED ORDER — LIDOCAINE HCL (PF) 2 % IJ SOLN
INTRAMUSCULAR | Status: AC
  Filled 2020-08-29: qty 5

## 2020-08-29 MED ORDER — PROPOFOL 500 MG/50ML IV EMUL
INTRAVENOUS | Status: AC
  Filled 2020-08-29: qty 50

## 2020-08-29 MED ORDER — FENTANYL CITRATE (PF) 100 MCG/2ML IJ SOLN
INTRAMUSCULAR | Status: AC
  Filled 2020-08-29: qty 2

## 2020-08-29 MED ORDER — DEXMEDETOMIDINE (PRECEDEX) IN NS 20 MCG/5ML (4 MCG/ML) IV SYRINGE
PREFILLED_SYRINGE | INTRAVENOUS | Status: AC
  Filled 2020-08-29: qty 5

## 2020-08-29 MED ORDER — FENTANYL CITRATE (PF) 100 MCG/2ML IJ SOLN
INTRAMUSCULAR | Status: DC | PRN
  Administered 2020-08-29: 25 ug via INTRAVENOUS

## 2020-08-29 MED ORDER — OXYCODONE HCL 5 MG PO TABS: 5.0000 mg | ORAL_TABLET | Freq: Four times a day (QID) | ORAL | 0 refills | Status: DC | PRN

## 2020-08-29 MED ORDER — SODIUM CHLORIDE 0.9% FLUSH: 3.0000 mL | Freq: Two times a day (BID) | INTRAVENOUS | Status: DC

## 2020-08-29 MED ORDER — GABAPENTIN 300 MG PO CAPS
300.0000 mg | ORAL_CAPSULE | ORAL | Status: AC
  Administered 2020-08-29: 300 mg via ORAL

## 2020-08-29 MED ORDER — GABAPENTIN 300 MG PO CAPS
ORAL_CAPSULE | ORAL | Status: AC
  Filled 2020-08-29: qty 1

## 2020-08-29 MED ORDER — ONDANSETRON HCL 4 MG/2ML IJ SOLN
INTRAMUSCULAR | Status: AC
  Filled 2020-08-29: qty 2

## 2020-08-29 MED ORDER — BUPIVACAINE LIPOSOME 1.3 % IJ SUSP
INTRAMUSCULAR | Status: DC | PRN
  Administered 2020-08-29: 20 mL

## 2020-08-29 MED ORDER — ACETAMINOPHEN 500 MG PO TABS
1000.0000 mg | ORAL_TABLET | ORAL | Status: AC
  Administered 2020-08-29: 1000 mg via ORAL

## 2020-08-29 MED ORDER — ACETAMINOPHEN 500 MG PO TABS: 1000.0000 mg | ORAL_TABLET | Freq: Once | ORAL | Status: DC

## 2020-08-29 MED ORDER — BUPIVACAINE-EPINEPHRINE 0.5% -1:200000 IJ SOLN
INTRAMUSCULAR | Status: DC | PRN
  Administered 2020-08-29: 30 mL

## 2020-08-29 MED ORDER — FENTANYL CITRATE (PF) 100 MCG/2ML IJ SOLN: 25.0000 ug | INTRAMUSCULAR | Status: DC | PRN

## 2020-08-29 MED ORDER — LACTATED RINGERS IV SOLN: INTRAVENOUS | Status: DC

## 2020-08-29 MED ORDER — LIDOCAINE HCL (CARDIAC) PF 100 MG/5ML IV SOSY
PREFILLED_SYRINGE | INTRAVENOUS | Status: DC | PRN
  Administered 2020-08-29: 40 mg via INTRAVENOUS

## 2020-08-29 MED ORDER — 0.9 % SODIUM CHLORIDE (POUR BTL) OPTIME
TOPICAL | Status: DC | PRN
  Administered 2020-08-29: 500 mL

## 2020-08-29 MED ORDER — DEXMEDETOMIDINE (PRECEDEX) IN NS 20 MCG/5ML (4 MCG/ML) IV SYRINGE
PREFILLED_SYRINGE | INTRAVENOUS | Status: DC | PRN
  Administered 2020-08-29: 8 ug via INTRAVENOUS

## 2020-08-29 MED ORDER — LIDOCAINE 5 % EX OINT
TOPICAL_OINTMENT | CUTANEOUS | Status: DC | PRN
  Administered 2020-08-29: 1

## 2020-08-29 MED ORDER — GLYCOPYRROLATE 0.2 MG/ML IJ SOLN
INTRAMUSCULAR | Status: DC | PRN
  Administered 2020-08-29: .1 mg via INTRAVENOUS

## 2020-08-29 MED ORDER — ONDANSETRON HCL 4 MG/2ML IJ SOLN
INTRAMUSCULAR | Status: DC | PRN
  Administered 2020-08-29: 4 mg via INTRAVENOUS

## 2020-08-29 SURGICAL SUPPLY — 37 items
BLADE EXTENDED COATED 6.5IN (ELECTRODE) IMPLANT
BLADE HEX COATED 2.75 (ELECTRODE) ×2 IMPLANT
BLADE SURG 10 STRL SS (BLADE) IMPLANT
COVER BACK TABLE 60X90IN (DRAPES) ×2 IMPLANT
COVER MAYO STAND STRL (DRAPES) ×2 IMPLANT
DRAPE LAPAROTOMY 100X72 PEDS (DRAPES) ×2 IMPLANT
DRAPE UTILITY XL STRL (DRAPES) ×2 IMPLANT
DRSG PAD ABDOMINAL 8X10 ST (GAUZE/BANDAGES/DRESSINGS) ×2 IMPLANT
ELECT REM PT RETURN 9FT ADLT (ELECTROSURGICAL) ×2
ELECTRODE REM PT RTRN 9FT ADLT (ELECTROSURGICAL) ×1 IMPLANT
GAUZE 4X4 16PLY ~~LOC~~+RFID DBL (SPONGE) ×2 IMPLANT
GAUZE SPONGE 4X4 12PLY STRL (GAUZE/BANDAGES/DRESSINGS) IMPLANT
GAUZE SPONGE 4X4 12PLY STRL LF (GAUZE/BANDAGES/DRESSINGS) ×1 IMPLANT
GLOVE SURG ENC MOIS LTX SZ6.5 (GLOVE) ×2 IMPLANT
GLOVE SURG UNDER POLY LF SZ7 (GLOVE) ×2 IMPLANT
KIT SIGMOIDOSCOPE (SET/KITS/TRAYS/PACK) IMPLANT
KIT TURNOVER CYSTO (KITS) ×2 IMPLANT
NEEDLE HYPO 22GX1.5 SAFETY (NEEDLE) ×2 IMPLANT
NS IRRIG 500ML POUR BTL (IV SOLUTION) ×2 IMPLANT
PACK BASIN DAY SURGERY FS (CUSTOM PROCEDURE TRAY) ×2 IMPLANT
PAD ARMBOARD 7.5X6 YLW CONV (MISCELLANEOUS) IMPLANT
PANTS MESH DISP LRG (UNDERPADS AND DIAPERS) IMPLANT
PANTS MESH DISPOSABLE L (UNDERPADS AND DIAPERS)
PENCIL SMOKE EVACUATOR (MISCELLANEOUS) ×2 IMPLANT
SPONGE SURGIFOAM ABS GEL 100 (HEMOSTASIS) IMPLANT
SPONGE SURGIFOAM ABS GEL 12-7 (HEMOSTASIS) IMPLANT
SUT CHROMIC 2 0 SH (SUTURE) ×2 IMPLANT
SUT CHROMIC 3 0 SH 27 (SUTURE) ×2 IMPLANT
SUT VIC AB 2-0 SH 27 (SUTURE)
SUT VIC AB 2-0 SH 27XBRD (SUTURE) IMPLANT
SUT VIC AB 4-0 SH 18 (SUTURE) IMPLANT
SYR CONTROL 10ML LL (SYRINGE) ×2 IMPLANT
TOWEL OR 17X26 10 PK STRL BLUE (TOWEL DISPOSABLE) ×2 IMPLANT
TRAY DSU PREP LF (CUSTOM PROCEDURE TRAY) ×2 IMPLANT
TUBE CONNECTING 12X1/4 (SUCTIONS) ×2 IMPLANT
WATER STERILE IRR 500ML POUR (IV SOLUTION) IMPLANT
YANKAUER SUCT BULB TIP NO VENT (SUCTIONS) ×2 IMPLANT

## 2020-08-29 NOTE — Anesthesia Postprocedure Evaluation (Signed)
Anesthesia Post Note  Patient: Laura Baker  Procedure(s) Performed: HEMORRHOIDECTOMY (Rectum)     Patient location during evaluation: PACU Anesthesia Type: MAC Level of consciousness: awake and alert Pain management: pain level controlled Vital Signs Assessment: post-procedure vital signs reviewed and stable Respiratory status: spontaneous breathing, nonlabored ventilation, respiratory function stable and patient connected to nasal cannula oxygen Cardiovascular status: stable and blood pressure returned to baseline Postop Assessment: no apparent nausea or vomiting Anesthetic complications: no   No notable events documented.  Last Vitals:  Vitals:   08/29/20 1345 08/29/20 1400  BP: (!) 104/53 (!) 102/55  Pulse: 80 83  Resp: 15 18  Temp:    SpO2: 96% 99%    Last Pain:  Vitals:   08/29/20 1400  TempSrc:   PainSc: 0-No pain                 Zamaria Brazzle L Dominie Benedick

## 2020-08-29 NOTE — Transfer of Care (Addendum)
Immediate Anesthesia Transfer of Care Note  Patient: Laura Baker  Procedure(s) Performed: Procedure(s) (LRB): HEMORRHOIDECTOMY (N/A)  Patient Location: PACU  Anesthesia Type: MAC  Level of Consciousness: awake, alert  and oriented  Airway & Oxygen Therapy: Patient Spontanous Breathing and on room air  Post-op Assessment: Report given to PACU RN and Post -op Vital signs reviewed and stable  Post vital signs: Reviewed and stable  Complications: No apparent anesthesia complications  Last Vitals:  Vitals Value Taken Time  BP 103/45 08/29/20 1330  Temp    Pulse 83 08/29/20 1336  Resp 14 08/29/20 1336  SpO2 98 % 08/29/20 1336  Vitals shown include unvalidated device data.  Last Pain:  Vitals:   08/29/20 1032  TempSrc: Oral  PainSc: 0-No pain      Patients Stated Pain Goal: 5 (93/[id number]/42 [id number])  Complications: No notable events documented.

## 2020-08-29 NOTE — Anesthesia Preprocedure Evaluation (Signed)
Anesthesia Evaluation  Patient identified by MRN, date of birth, ID band Patient awake    Reviewed: Allergy & Precautions, NPO status , Patient's Chart, lab work & pertinent test results  Airway Mallampati: II  TM Distance: >3 FB Neck ROM: Full    Dental no notable dental hx.    Pulmonary neg pulmonary ROS,    Pulmonary exam normal breath sounds clear to auscultation       Cardiovascular negative cardio ROS Normal cardiovascular exam Rhythm:Regular Rate:Normal     Neuro/Psych negative neurological ROS  negative psych ROS   GI/Hepatic Neg liver ROS, GERD  ,  Endo/Other  negative endocrine ROS  Renal/GU negative Renal ROS  negative genitourinary   Musculoskeletal negative musculoskeletal ROS (+)   Abdominal   Peds  Hematology negative hematology ROS (+)   Anesthesia Other Findings   Reproductive/Obstetrics                             Anesthesia Physical Anesthesia Plan  ASA: 2  Anesthesia Plan: MAC   Post-op Pain Management:    Induction: Intravenous  PONV Risk Score and Plan: 2 and Propofol infusion, Treatment may vary due to age or medical condition, Midazolam and Ondansetron  Airway Management Planned: Natural Airway  Additional Equipment:   Intra-op Plan:   Post-operative Plan:   Informed Consent: I have reviewed the patients History and Physical, chart, labs and discussed the procedure including the risks, benefits and alternatives for the proposed anesthesia with the patient or authorized representative who has indicated his/her understanding and acceptance.     Dental advisory given  Plan Discussed with: CRNA  Anesthesia Plan Comments:         Anesthesia Quick Evaluation

## 2020-08-29 NOTE — H&P (Signed)
The patient is a 66 year old female who presents with hemorrhoids. 65 year old female who presents to the office for evaluation of hemorrhoid disease.  She is states that over the past 2 years.  She is had worsening trouble with bleeding and drainage from her hemorrhoids.  She had a colonoscopy approximately 2 years ago which only showed 1 polyp.  She reports difficulty with incomplete evacuation, but denies any recent history of constipation.  She has never had any hemorrhoid procedures in the past.     Past Surgical History Janeann Forehand, CNA; 07/15/2020 10:32 AM) Cataract Surgery  Bilateral. Hysterectomy (not due to cancer) - Partial    Diagnostic Studies History Janeann Forehand, CNA; 07/15/2020 10:32 AM) Colonoscopy  1-5 years ago Mammogram  1-3 years ago   Allergies Janeann Forehand, CNA; 07/15/2020 10:34 AM) Actonel *ENDOCRINE AND METABOLIC AGENTS - MISC.*  Risedronate Sodium *ENDOCRINE AND METABOLIC AGENTS - MISC.*  Sulfa 10 *OPHTHALMIC AGENTS*  Allergies Reconciled    Medication History Janeann Forehand, CNA; 07/15/2020 10:34 AM) Estradiol  (0.'1MG'$ /GM Cream, Vaginal) Active. Medications Reconciled    Social History Janeann Forehand, CNA; 07/15/2020 10:32 AM) Caffeine use  Coffee. No alcohol use  No drug use  Tobacco use  Never smoker.   Family History Janeann Forehand, CNA; 07/15/2020 10:32 AM) Arthritis  Mother. Cancer  Father. Cerebrovascular Accident  Mother. Diabetes Mellitus  Mother, Sister. Heart Disease  Mother. Heart disease in female family member before age 53  Respiratory Condition  Father. Thyroid problems  Brother, Mother.   Pregnancy / Birth History Janeann Forehand, CNA; 07/15/2020 10:32 AM) Age at menarche  81 years. Age of menopause  22-55 Gravida  3 Maternal age  71-35 Para  67   Other Problems Janeann Forehand, CNA; 07/15/2020 10:32 AM) Hemorrhoids          Review of Systems  General Not Present- Appetite Loss, Chills, Fatigue, Fever, Night  Sweats, Weight Gain and Weight Loss. Skin Not Present- Change in Wart/Mole, Dryness, Hives, Jaundice, New Lesions, Non-Healing Wounds, Rash and Ulcer. HEENT Present- Wears glasses/contact lenses. Not Present- Earache, Hearing Loss, Hoarseness, Nose Bleed, Oral Ulcers, Ringing in the Ears, Seasonal Allergies, Sinus Pain, Sore Throat, Visual Disturbances and Yellow Eyes. Respiratory Not Present- Bloody sputum, Chronic Cough, Difficulty Breathing, Snoring and Wheezing. Breast Not Present- Breast Mass, Breast Pain, Nipple Discharge and Skin Changes. Cardiovascular Not Present- Chest Pain, Difficulty Breathing Lying Down, Leg Cramps, Palpitations, Rapid Heart Rate, Shortness of Breath and Swelling of Extremities. Gastrointestinal Present- Change in Bowel Habits and Hemorrhoids. Not Present- Abdominal Pain, Bloating, Bloody Stool, Chronic diarrhea, Constipation, Difficulty Swallowing, Excessive gas, Gets full quickly at meals, Indigestion, Nausea, Rectal Pain and Vomiting. Female Genitourinary Present- Nocturia. Not Present- Frequency, Painful Urination, Pelvic Pain and Urgency. Musculoskeletal Not Present- Back Pain, Joint Pain, Joint Stiffness, Muscle Pain, Muscle Weakness and Swelling of Extremities. Neurological Not Present- Decreased Memory, Fainting, Headaches, Numbness, Seizures, Tingling, Tremor, Trouble walking and Weakness. Psychiatric Not Present- Anxiety, Bipolar, Change in Sleep Pattern, Depression, Fearful and Frequent crying. Endocrine Not Present- Cold Intolerance, Excessive Hunger, Hair Changes, Heat Intolerance, Hot flashes and New Diabetes. Hematology Not Present- Blood Thinners, Easy Bruising, Excessive bleeding, Gland problems, HIV and Persistent Infections.  BP (!) 145/70   Pulse 77   Temp 98.7 F (37.1 C) (Oral)   Resp 14   Ht '5\' 2"'$  (1.575 m)   Wt 59.9 kg   LMP 11/01/2008   SpO2 98%   BMI 24.14 kg/m     Physical Exam  General Mental Status - Alert. General Appearance  - Cooperative.  CV: RRR Lungs: CTA Abd: soft Rectal Anorectal Exam External - skin tag (small). Note: Grade 3. Moderately difficult to reduce. Internal - normal sphincter tone.       Assessment & Plan    PROLAPSED INTERNAL HEMORRHOIDS, GRADE 3 [id number]) Impression: 65 year old female with right anterior, right posterior grade 3 hemorrhoids. We discussed transanal hemorrhoidal dearterialization versus standard hemorrhoidectomy. I think standard hemorrhoidectomy would be the best treatment for her given the chronic nature of her prolapse. We have discussed the significant amount of pain that can occur after surgery. We discussed ways to manage her pain better at home. We discussed the risk of bleeding. We discussed the risk of recurrence. All questions were answered.

## 2020-08-29 NOTE — Anesthesia Procedure Notes (Signed)
Procedure Name: MAC Date/Time: 08/29/2020 12:49 PM Performed by: Mechele Claude, CRNA Pre-anesthesia Checklist: Patient identified, Emergency Drugs available, Suction available and Patient being monitored Oxygen Delivery Method: Simple face mask Placement Confirmation: positive ETCO2 and breath sounds checked- equal and bilateral

## 2020-08-29 NOTE — Op Note (Signed)
08/29/2020  1:19 PM  PATIENT:  Laura Baker  65 y.o. female  Patient Care Team: Denita Lung, MD as PCP - General (Family Medicine)  PRE-OPERATIVE DIAGNOSIS:  GRADE 3 HEMORRHOIDS  POST-OPERATIVE DIAGNOSIS:  GRADE 3 HEMORRHOIDS  PROCEDURE:   2 COLUMN HEMORRHOIDECTOMY   Surgeon(s): Leighton Ruff, MD  ASSISTANT: none   ANESTHESIA:   local and MAC  SPECIMEN:  Source of Specimen:  R anterior and R posterior hemorrhoids  DISPOSITION OF SPECIMEN:  PATHOLOGY  COUNTS:  YES  PLAN OF CARE: Discharge to home after PACU  PATIENT DISPOSITION:  PACU - hemodynamically stable.  INDICATION: 65 year old female with grade 3 bleeding internal hemorrhoids   OR FINDINGS: Grade 3 right posterior and right anterior internal hemorrhoids with minimal external component  DESCRIPTION: the patient was identified in the preoperative holding area and taken to the OR where they were laid on the operating room table.  MAC anesthesia was induced without difficulty. The patient was then positioned in prone jackknife position with buttocks gently taped apart.  The patient was then prepped and draped in usual sterile fashion.  SCDs were noted to be in place prior to the initiation of anesthesia. A surgical timeout was performed indicating the correct patient, procedure, positioning and need for preoperative antibiotics.  A rectal block was performed using Marcaine with epinephrine mixed with Exparel.    I began with a digital rectal exam.  The patient had good rectal tone.  There were no anal masses noted.  I then placed a Hill-Ferguson anoscope into the anal canal and evaluated this completely.  The patient had grade 3 hemorrhoids in the right anterior and right posterior position.  The largest of these was the right anterior hemorrhoid.  There was no left lateral prolapsing hemorrhoid.  I began with the right anterior hemorrhoid.  The skin was elevated and an incision was made using Metzenbaum scissors.   I dissected down to the level of the sphincter complex using blunt dissection.  The sphincter was identified and preserved.  The remaining tissue above this was excised down to the level of the distal rectal mucosa.  The anoderm was then closed using a running 2-0 chromic suture.  I then identified the right posterior hemorrhoid and elevated this off of the sphincter complex using a Allis clamp.  I used Metzenbaums to divide the skin and then bluntly dissected down to the sphincter complex.  The hemorrhoidal tissue above this was removed using the Metzenbaum scissors.  The anoderm was also reapproximated using a running 2-0 chromic suture.  The anal margin skin of both incisions was then closed using a running 3-0 chromic suture.  Hemostasis was good.  Additional anesthetic was placed around the incision sites.  Lidocaine ointment and sterile dressing were then applied.  The patient was awakened from anesthesia and sent to the postanesthesia care unit in stable condition.  All counts were correct per operating room staff.

## 2020-08-29 NOTE — Discharge Instructions (Addendum)
ANORECTAL SURGERY: POST OP INSTRUCTIONS Take your usually prescribed home medications unless otherwise directed. DIET: During the first few hours after surgery sip on some liquids until you are able to urinate.  It is normal to not urinate for several hours after this surgery.  If you feel uncomfortable, please contact the office for instructions.  After you are able to urinate,you may eat, if you feel like it.  Follow a light bland diet the first 24 hours after arrival home, such as soup, liquids, crackers, etc.  Be sure to include lots of fluids daily (6-8 glasses).  Avoid fast food or heavy meals, as your are more likely to get nauseated.  Eat a low fat diet the next few days after surgery.  Limit caffeine intake to 1-2 servings a day. PAIN CONTROL: Pain is best controlled by a usual combination of several different methods TOGETHER: Muscle relaxation: Soak in a warm bath (or Sitz bath) three times a day and after bowel movements.  Continue to do this until all pain is resolved. Over the counter pain medication Prescription pain medication Most patients will experience some swelling and discomfort in the anus/rectal area and incisions.  Heat such as warm towels, sitz baths, warm baths, etc to help relax tight/sore spots and speed recovery.  Some people prefer to use ice, especially in the first couple days after surgery, as it may decrease the pain and swelling, or alternate between ice & heat.  Experiment to what works for you.  Swelling and bruising can take several weeks to resolve.  Pain can take even longer to completely resolve. It is helpful to take an over-the-counter pain medication regularly for the first few weeks.  Choose one of the following that works best for you: Naproxen (Aleve, etc)  Two 220mg tabs twice a day Ibuprofen (Advil, etc) Three 200mg tabs four times a day (every meal & bedtime) A  prescription for pain medication (such as percocet, oxycodone, hydrocodone, etc) should be  given to you upon discharge.  Take your pain medication as prescribed.  If you are having problems/concerns with the prescription medicine (does not control pain, nausea, vomiting, rash, itching, etc), please call us [phone] to see if we need to switch you to a different pain medicine that will work better for you and/or control your side effect better. If you need a refill on your pain medication, please contact your pharmacy.  They will contact our office to request authorization. Prescriptions will not be filled after 5 pm or on week-ends. KEEP YOUR BOWELS REGULAR and AVOID CONSTIPATION The goal is one to two soft bowel movements a day.  You should at least have a bowel movement every other day. Avoid getting constipated.  Between the surgery and the pain medications, it is common to experience some constipation. This can be very painful after rectal surgery.  Increasing fluid intake and taking a fiber supplement (such as Metamucil, Citrucel, FiberCon, etc) 1-2 times a day regularly will usually help prevent this problem from occurring.  A stool softener like colace is also recommended.  This can be purchased over the counter at your pharmacy.  You can take it up to 3 times a day.  If you do not have a bowel movement after 24 hrs since your surgery, take one does of milk of magnesia.  If you still haven't had a bowel movement 8-12 hours after that dose, take another dose.  If you don't have a bowel movement 48 hrs after surgery,   purchase a Fleets enema from the drug store and administer gently per package instructions.  If you still are having trouble with your bowel movements after that, please call the office for further instructions. If you develop diarrhea or have many loose bowel movements, simplify your diet to bland foods & liquids for a few days.  Stop any stool softeners and decrease your fiber supplement.  Switching to mild anti-diarrheal medications (Kayopectate, Pepto Bismol) can help.   If this worsens or does not improve, please call us.  Wound Care Remove your bandages before your first bowel movement or 8 hours after surgery.     Remove any wound packing material at this tim,e as well.  You do not need to repack the wound unless instructed otherwise.  Wear an absorbent pad or soft cotton gauze in your underwear to catch any drainage and help keep the area clean. You should change this every 2-3 hours while awake. Keep the area clean and dry.  Bathe / shower every day, especially after bowel movements.  Keep the area clean by showering / bathing over the incision / wound.   It is okay to soak an open wound to help wash it.  Wet wipes or showers / gentle washing after bowel movements is often less traumatic than regular toilet paper. You may have some styrofoam-like soft packing in the rectum which will come out with the first bowel movement.  You will often notice bleeding with bowel movements.  This should slow down by the end of the first week of surgery Expect some drainage.  This should slow down, too, by the end of the first week of surgery.  Wear an absorbent pad or soft cotton gauze in your underwear until the drainage stops. Do Not sit on a rubber or pillow ring.  This can make you symptoms worse.  You may sit on a soft pillow if needed.  ACTIVITIES as tolerated:   You may resume regular (light) daily activities beginning the next day--such as daily self-care, walking, climbing stairs--gradually increasing activities as tolerated.  If you can walk 30 minutes without difficulty, it is safe to try more intense activity such as jogging, treadmill, bicycling, low-impact aerobics, swimming, etc. Save the most intensive and strenuous activity for last such as sit-ups, heavy lifting, contact sports, etc  Refrain from any heavy lifting or straining until you are off narcotics for pain control.   You may drive when you are no longer taking prescription pain medication, you can  comfortably sit for long periods of time, and you can safely maneuver your car and apply brakes. You may have sexual intercourse when it is comfortable.  FOLLOW UP in our office Please call [facility] at [phone] to set up an appointment to see your surgeon in the office for a follow-up appointment approximately 3-4 weeks after your surgery. Make sure that you call for this appointment the day you arrive home to insure a convenient appointment time. 10. IF YOU HAVE DISABILITY OR FAMILY LEAVE FORMS, BRING THEM TO THE OFFICE FOR PROCESSING.  DO NOT GIVE THEM TO YOUR DOCTOR.     WHEN TO CALL us [phone]: Poor pain control Reactions / problems with new medications (rash/itching, nausea, etc)  Fever over 101.5 F (38.5 C) Inability to urinate Nausea and/or vomiting Worsening swelling or bruising Continued bleeding from incision. Increased pain, redness, or drainage from the incision  The clinic staff is available to answer your questions during regular business hours (8:30am-5pm).  Please don't hesitate to call and ask to speak to one of our nurses for clinical concerns.   A surgeon from [facility] is always on call at the hospitals   If you have a medical emergency, go to the nearest emergency room or call 911.    [facility], [address], [address], [place], [place]  [address] ? MAIN: [phone] ? TOLL FREE: [phone] ? FAX [phone] [email or web address]    Post Anesthesia Home Care Instructions  Activity: Get plenty of rest for the remainder of the day. A responsible individual must stay with you for 24 hours following the procedure.  For the next 24 hours, DO NOT: -Drive a car -Paediatric nurse -Drink alcoholic beverages -Take any medication unless instructed by your physician -Make any legal decisions or sign important papers.  Meals: Start with liquid foods such as gelatin or soup. Progress to regular  foods as tolerated. Avoid greasy, spicy, heavy foods. If nausea and/or vomiting occur, drink only clear liquids until the nausea and/or vomiting subsides. Call your physician if vomiting continues.  Special Instructions/Symptoms: Your throat may feel dry or sore from the anesthesia or the breathing tube placed in your throat during surgery. If this causes discomfort, gargle with warm salt water. The discomfort should disappear within 24 hours.    Information for Discharge Teaching: EXPAREL (bupivacaine liposome injectable suspension)   Your surgeon or anesthesiologist gave you EXPAREL(bupivacaine) to help control your pain after surgery.  EXPAREL is a local anesthetic that provides pain relief by numbing the tissue around the surgical site. EXPAREL is designed to release pain medication over time and can control pain for up to 72 hours. Depending on how you respond to EXPAREL, you may require less pain medication during your recovery.  Possible side effects: Temporary loss of sensation or ability to move in the area where bupivacaine was injected. Nausea, vomiting, constipation Rarely, numbness and tingling in your mouth or lips, lightheadedness, or anxiety may occur. Call your doctor right away if you think you may be experiencing any of these sensations, or if you have other questions regarding possible side effects.  Follow all other discharge instructions given to you by your surgeon or nurse. Eat a healthy diet and drink plenty of water or other fluids.  If you return to the hospital for any reason within 96 hours following the administration of EXPAREL, it is important for health care providers to know that you have received this anesthetic. A teal colored band has been placed on your arm with the date, time and amount of EXPAREL you have received in order to alert and inform your health care providers. Please leave this armband in place for the full 96 hours following administration, and  then you may remove the band.

## 2020-09-01 ENCOUNTER — Encounter (HOSPITAL_BASED_OUTPATIENT_CLINIC_OR_DEPARTMENT_OTHER): Payer: Self-pay | Admitting: General Surgery

## 2020-09-01 LAB — SURGICAL PATHOLOGY

## 2020-10-08 DIAGNOSIS — H524 Presbyopia: Secondary | ICD-10-CM | POA: Diagnosis not present

## 2021-01-19 ENCOUNTER — Other Ambulatory Visit: Payer: Self-pay

## 2021-01-19 ENCOUNTER — Encounter: Payer: Self-pay | Admitting: Family Medicine

## 2021-01-19 ENCOUNTER — Ambulatory Visit (INDEPENDENT_AMBULATORY_CARE_PROVIDER_SITE_OTHER): Payer: Medicare Other | Admitting: Family Medicine

## 2021-01-19 VITALS — Temp 100.2°F | Wt 132.0 lb

## 2021-01-19 DIAGNOSIS — U071 COVID-19: Secondary | ICD-10-CM | POA: Diagnosis not present

## 2021-01-19 NOTE — Progress Notes (Signed)
° °  Subjective:    Patient ID: Laura Baker, female    DOB: 09/19/1955, 65 y.o.   MRN: [id number]  HPI Documentation for virtual audio  telecommunications through [place] encounter: Unable to do video  The patient was located at home. 2 patient identifiers used.  The provider was located in the office. The patient did consent to this visit and is aware of possible charges through their insurance for this visit. The other persons participating in this telemedicine service were none. Time spent on call was 5 minutes and in review of previous records >16 minutes total for counseling and coordination of care. This virtual service is not related to other E/M service within previous 7 days.  She states that on Thursday she developed headache, fever,, cough, chest congestion and the next day developed headache and chills.  She did test positive for COVID.  She has had 2 of the vaccines.  Today she seems to have held her own with the symptoms from over the weekend.  Review of Systems     Objective:   Physical Exam Alert and in no distress otherwise not examined       Assessment & Plan:  COVID-19 I explained that we have a 5-day window which would get her through Tuesday of this week.  She seems to be holding her own so I recommend continuing with Tylenol and alternating that with ibuprofen 600 mg 3 times daily.  She can also use Robitussin-DM and NyQuil at night.  Discussed the need for wearing a mask after the first 5 days.  She will call for me if she has further difficulties.

## 2021-02-01 DIAGNOSIS — C44310 Basal cell carcinoma of skin of unspecified parts of face: Secondary | ICD-10-CM

## 2021-02-01 HISTORY — DX: Basal cell carcinoma of skin of unspecified parts of face: C44.310

## 2021-04-22 NOTE — Progress Notes (Signed)
66 y.o. G36P3003 Married Caucasian female here for office visit.  ? ?She is followed for vaginal atrophy and osteoporosis.  ? ?No vaginal bleeding.  ?No pain with intercourse.   ? ?No bladder or bowel control problems.  ? ?She has some gum recession.  ?She has declined treatment for her osteoporosis.  ?Her last BMD showed osteoporosis of her right hip and osteopenia of her left hip and spine.  ? ?PCP: Jill Alexanders, MD   ? ?Patient's last menstrual period was 11/01/2008.     ?  ?    ?Sexually active: Yes.    ?The current method of family planning is status post hysterectomy.    ?Exercising: Yes.     Cardio 4x week ?Smoker:  no ? ?Health Maintenance: ?Pap: 04/30/20-WNL, 2010-WNL ?History of abnormal Pap:  no ?MMG: 07/16/20-neg birads 1 ?Colonoscopy: 2019 polyp; f/u 5 yrs ?BMD: 07/11/19  Result osteoporosis, declined tx ?TDaP: 04/21/18 ?Gardasil:   no ?HIV: 04/10/15-NR ?Hep C: 04/10/15- neg ?Screening Labs:  PCP.  ? ? reports that she has never smoked. She has never used smokeless tobacco. She reports that she does not drink alcohol and does not use drugs. ? ?Past Medical History:  ?Diagnosis Date  ? GERD (gastroesophageal reflux disease)   ? occasional tums  ? Grade III hemorrhoids   ? History of adenomatous polyp of colon   ? Nocturia   ? Osteoporosis 2017  ? Vitamin D deficiency   ? ? ?Past Surgical History:  ?Procedure Laterality Date  ? CATARACT EXTRACTION W/ INTRAOCULAR LENS IMPLANT Bilateral   ? left 04/ 2016;  right 20085  ? COLONOSCOPY    ? last one 07-22-2017 by dr Collene Mares  ? CYST REMOVAL NECK  2002  ? benign  ? HEMORRHOID SURGERY N/A 08/29/2020  ? Procedure: HEMORRHOIDECTOMY;  Surgeon: Leighton Ruff, MD;  Location: [facility];  Service: General;  Laterality: N/A;  ? ROBOTIC ASSISTED LAPAROSCOPIC SACROCOLPOPEXY  07/01/2010  ? @ [facility]  by dr .Yolonda Kida;  with mesh  ? ROBOTIC ASSISTED TOTAL HYSTERECTOMY  11/26/2008  ? '@[facility]'$    by dr Joan Flores  ? ? ?Current Outpatient Medications  ?Medication Sig Dispense Refill  ?  Acetaminophen (TYLENOL) 325 MG CAPS as needed.    ? calcium carbonate (TUMS - DOSED IN MG ELEMENTAL CALCIUM) 500 MG chewable tablet Chew 1 tablet by mouth as needed.    ? Calcium Citrate-Vitamin D (EQ CALCIUM CITRATE+D PO) Take 1 tablet by mouth 2 (two) times daily. Calcium  '500mg'$ /  Vit D  1000iu    ? estradiol (ESTRACE VAGINAL) 0.1 MG/GM vaginal cream Place 1/2 gram per vagina at bedtime 2 - 3 times per week. (Patient taking differently: as directed. Place 1/2 gram per vagina at bedtime 2 - 3 times per week.) 42.5 g 2  ? Multiple Vitamins-Minerals (CENTRUM SILVER ADULT 50+ PO) Take 1 tablet by mouth daily.    ? ?No current facility-administered medications for this visit.  ? ? ?Family History  ?Problem Relation Age of Onset  ? Hypertension Mother   ? Congestive Heart Failure Mother   ? Thyroid disease Mother   ? Osteoporosis Mother   ? Stroke Mother   ? Diabetes Mother   ? Lung cancer Father   ? Diabetes Sister   ? Thyroid disease Sister   ? Osteoporosis Sister   ? Thyroid disease Brother   ? Leukemia Sister   ? Thyroid disease Sister   ? Diabetes Sister   ? Kidney disease Sister   ?  Scleroderma Sister   ? Thyroid disease Brother   ? Rashes / Skin problems Other   ? ? ?Review of Systems  ?All other systems reviewed and are negative. ? ?Exam:   ?BP 128/82   Pulse (!) 110   Ht 5' 1.5" (1.562 m)   Wt 133 lb (60.3 kg)   LMP 11/01/2008   SpO2 95%   BMI 24.72 kg/m?     ?General appearance: alert, cooperative and appears stated age ?Head: normocephalic, without obvious abnormality, atraumatic ?Neck: no adenopathy, supple, symmetrical, trachea midline and thyroid normal to inspection and palpation ?Lungs: clear to auscultation bilaterally ?Breasts: normal appearance, no masses or tenderness, No nipple retraction or dimpling, No nipple discharge or bleeding, No axillary adenopathy ?Heart: regular rate and rhythm ?Abdomen: soft, non-tender; no masses, no organomegaly ?Extremities: extremities normal, atraumatic, no  cyanosis or edema ?Skin: skin color, texture, turgor normal. No rashes or lesions ?Lymph nodes: cervical, supraclavicular, and axillary nodes normal. ?Neurologic: grossly normal ? ?Pelvic: External genitalia:  no lesions ?             No abnormal inguinal nodes palpated. ?             Urethra:  normal appearing urethra with no masses, tenderness or lesions ?             Bartholins and Skenes: normal    ?             Vagina: normal appearing vagina with normal color and discharge, atrophy noted.  ?             Cervix: absent. ?             Pap taken: no ?Bimanual Exam:  Uterus:  absent.  Mesh palpable.   ?             Adnexa: no mass, fullness, tenderness ?             Rectal exam: yes.  Confirms. ?             Anus:  normal sphincter tone, hemorrhoids noted.  ? ?Chaperone was present for exam:  Kimalexis, CMA ? ?Assessment:   ?Well woman visit with gynecologic exam. ?Hx vaginal vault mesh erosion.  Asymptomatic.  Has consulted with the pelvic surgeon who did her surgery and she is doing observational management now for years.  ?Encounter for medication monitoring.  ?Hemorrhoids.  ?Osteoporosis.  Declines treatment. Hx stress fracture.  Intolerance to Actonel. ? ?Plan: ?Mammogram screening discussed. ?Self breast awareness reviewed. ?Pap and HR HPV as above. ?Guidelines for Calcium, Vitamin D, regular exercise program including cardiovascular and weight bearing exercise. ?Refill of Estrace cream. 1/2 gram pv at hs 2 - 3 times per week.  I discussed potential effect on breast cancer.  ?[facility] at [facility].  She will call to schedule and I will sign an order when it comes through. ?Follow up annually and prn.  ? ?After visit summary provided.  ? ?26 min  total time was spent for this patient encounter, including preparation, face-to-face counseling with the patient, coordination of care, and documentation of the encounter. ? ? ? ? ?

## 2021-04-23 ENCOUNTER — Encounter: Payer: Self-pay | Admitting: Family Medicine

## 2021-04-23 ENCOUNTER — Ambulatory Visit (INDEPENDENT_AMBULATORY_CARE_PROVIDER_SITE_OTHER): Payer: Medicare Other | Admitting: Family Medicine

## 2021-04-23 VITALS — BP 126/70 | HR 97 | Temp 97.7°F | Ht 62.0 in | Wt 132.8 lb

## 2021-04-23 DIAGNOSIS — E785 Hyperlipidemia, unspecified: Secondary | ICD-10-CM

## 2021-04-23 DIAGNOSIS — M810 Age-related osteoporosis without current pathological fracture: Secondary | ICD-10-CM

## 2021-04-23 DIAGNOSIS — Z86010 Personal history of colonic polyps: Secondary | ICD-10-CM

## 2021-04-23 DIAGNOSIS — Z0000 Encounter for general adult medical examination without abnormal findings: Secondary | ICD-10-CM

## 2021-04-23 DIAGNOSIS — E559 Vitamin D deficiency, unspecified: Secondary | ICD-10-CM

## 2021-04-23 DIAGNOSIS — J3489 Other specified disorders of nose and nasal sinuses: Secondary | ICD-10-CM | POA: Diagnosis not present

## 2021-04-23 DIAGNOSIS — Z5320 Procedure and treatment not carried out because of patient's decision for unspecified reasons: Secondary | ICD-10-CM

## 2021-04-23 NOTE — Progress Notes (Signed)
? ?  Subjective:  ? ? Patient ID: Laura Baker, female    DOB: 04/11/1955, 66 y.o.   MRN: [id number] ? ?HPI ?She is here for complete examination.  She has a lesion present on her nose that she would like further evaluated.  She does have a history of osteoporosis and has been tried on Boniva but did not tolerate it.  Shots were recommended however she is not interested in the shots either.  She does have a history of colonic polyps and is scheduled for repeat colonoscopy in 2024.  She has a mammogram scheduled in June.  She has had a hysterectomy.  She does have a history of hyperlipidemia and vitamin D insufficiency.  She is retired and does take care of 2 children and enjoys this. ? ? ?Review of Systems  ?All other systems reviewed and are negative. ? ?   ?Objective:  ? Physical Exam ?Alert and in no distress.  A 1 cm round smooth slightly vascular lesion is noted on the right side of the nose.  Tympanic membranes and canals are normal. Pharyngeal area is normal. Neck is supple without adenopathy or thyromegaly. Cardiac exam shows a regular sinus rhythm without murmurs or gallops. Lungs are clear to auscultation.  Abdominal exam shows no masses or tenderness with normal bowel sounds ? ? ? ? ?   ?Assessment & Plan:  ?Routine general medical examination at a health care facility - Plan: CBC with Differential/Platelet, Comprehensive metabolic panel, Lipid panel ? ?Osteoporosis without current pathological fracture, unspecified osteoporosis type ? ?Nasal lesion - Plan: Ambulatory referral to Dermatology ? ?History of colonic polyps ? ?Vitamin D insufficiency - Plan: VITAMIN D 25 Hydroxy (Vit-D Deficiency, Fractures) ? ?Hyperlipidemia, unspecified hyperlipidemia type - Plan: Lipid panel ? ?Medication refused ?Discussed the diagnosis of osteoporosis and the risk of fracture.  Also discussed the fact that she is low on vitamin D.  Strongly encouraged her to consider getting shots but at this time she is still not  interested.  Referral will be made to dermatology for the lesion. ? ?

## 2021-04-24 LAB — COMPREHENSIVE METABOLIC PANEL
ALT: 23 IU/L (ref 0–32)
AST: 25 IU/L (ref 0–40)
Albumin/Globulin Ratio: 1.4 (ref 1.2–2.2)
Albumin: 4.3 g/dL (ref 3.8–4.8)
Alkaline Phosphatase: 115 IU/L (ref 44–121)
BUN/Creatinine Ratio: 18 (ref 12–28)
BUN: 15 mg/dL (ref 8–27)
Bilirubin Total: 0.7 mg/dL (ref 0.0–1.2)
CO2: 26 mmol/L (ref 20–29)
Calcium: 10 mg/dL (ref 8.7–10.3)
Chloride: 100 mmol/L (ref 96–106)
Creatinine, Ser: 0.85 mg/dL (ref 0.57–1.00)
Globulin, Total: 3.1 g/dL (ref 1.5–4.5)
Glucose: 93 mg/dL (ref 70–99)
Potassium: 4.8 mmol/L (ref 3.5–5.2)
Sodium: 145 mmol/L — ABNORMAL HIGH (ref 134–144)
Total Protein: 7.4 g/dL (ref 6.0–8.5)
eGFR: 75 mL/min/{1.73_m2} (ref >59)

## 2021-04-24 LAB — CBC WITH DIFFERENTIAL/PLATELET
Basophils Absolute: 0.1 10*3/uL (ref 0.0–0.2)
Basos: 1 %
EOS (ABSOLUTE): 0.1 10*3/uL (ref 0.0–0.4)
Eos: 1 %
Hematocrit: 42 % (ref 34.0–46.6)
Hemoglobin: 14.5 g/dL (ref 11.1–15.9)
Immature Grans (Abs): 0 10*3/uL (ref 0.0–0.1)
Immature Granulocytes: 0 %
Lymphocytes Absolute: 1.1 10*3/uL (ref 0.7–3.1)
Lymphs: 19 %
MCH: 32.2 pg (ref 26.6–33.0)
MCHC: 34.5 g/dL (ref 31.5–35.7)
MCV: 93 fL (ref 79–97)
Monocytes Absolute: 0.4 10*3/uL (ref 0.1–0.9)
Monocytes: 8 %
Neutrophils Absolute: 4.2 10*3/uL (ref 1.4–7.0)
Neutrophils: 71 %
Platelets: 265 10*3/uL (ref 150–450)
RBC: 4.51 x10E6/uL (ref 3.77–5.28)
RDW: 11.9 % (ref 11.7–15.4)
WBC: 5.9 10*3/uL (ref 3.4–10.8)

## 2021-04-24 LAB — LIPID PANEL
Chol/HDL Ratio: 2.8 ratio (ref 0.0–4.4)
Cholesterol, Total: 159 mg/dL (ref 100–199)
HDL: 56 mg/dL (ref >39)
LDL Chol Calc (NIH): 88 mg/dL (ref 0–99)
Triglycerides: 80 mg/dL (ref 0–149)
VLDL Cholesterol Cal: 15 mg/dL (ref 5–40)

## 2021-04-24 LAB — VITAMIN D 25 HYDROXY (VIT D DEFICIENCY, FRACTURES): Vit D, 25-Hydroxy: 35.3 ng/mL (ref 30.0–100.0)

## 2021-04-29 ENCOUNTER — Encounter: Payer: Self-pay | Admitting: Family Medicine

## 2021-04-29 ENCOUNTER — Other Ambulatory Visit: Payer: Self-pay

## 2021-04-29 ENCOUNTER — Telehealth (INDEPENDENT_AMBULATORY_CARE_PROVIDER_SITE_OTHER): Payer: Medicare Other | Admitting: Family Medicine

## 2021-04-29 VITALS — BP 126/70 | Wt 132.0 lb

## 2021-04-29 DIAGNOSIS — Z2821 Immunization not carried out because of patient refusal: Secondary | ICD-10-CM

## 2021-04-29 DIAGNOSIS — Z0000 Encounter for general adult medical examination without abnormal findings: Secondary | ICD-10-CM

## 2021-04-29 DIAGNOSIS — Z86010 Personal history of colonic polyps: Secondary | ICD-10-CM

## 2021-04-29 DIAGNOSIS — M810 Age-related osteoporosis without current pathological fracture: Secondary | ICD-10-CM | POA: Diagnosis not present

## 2021-04-29 NOTE — Progress Notes (Signed)
Laura Baker is a 66 y.o. female who presents for annual wellness visit and follow-up on chronic medical conditions. Location of [place]: Home ?Location of Provider: In office ?Persons participating in vist: Kaedyn Belardo ?I connected with  Christeena Krogh Bogert on 04/29/21 by a telephone enabled telemedicine application and verified that I am speaking with the correct person using two identifiers. ? ?I discussed the limitations of evaluation and management by telemedicine. The patient expressed understanding and agreed to proceed.  ? She has a history of osteoporosis.  She was given a bisphosphonate in the past but apparently had difficulty with that and is not interested in trying that again.  I mention getting a twice per year shot and again she is not interested in that because she does not like shots.  She is also refused any immunizations including COVID, pneumonia, Shingrix.  She does have a history of colonic polyps and does plan to get a repeat colonoscopy. ? ?Immunizations and Health Maintenance ?Immunization History  ?Administered Date(s) Administered  ? Influenza,inj,Quad PF,6+ Mos 12/27/2012, 12/04/2016, 12/03/2017, 12/08/2019, 12/06/2020  ? Influenza-Unspecified 01/29/2016, 12/08/2019  ? PFIZER(Purple Top)SARS-COV-2 Vaccination 08/02/2019, 08/23/2019  ? Tdap 02/02/2008, 04/21/2018  ? ?There are no preventive care reminders to display for this patient. ? ?Last Pap smear: Aged out  ?Last mammogram: 07/16/20 ?Last colonoscopy: 07/22/17 Dr. Collene Mares ?Last DEXA: 07/11/19 ?Dentist:Q six months ?Ophtho: Q year ?Exercise:  walking QD ? ?Other doctors caring for patient include:Dr. Collene Mares GI ?         Dr. Quincy Simmonds GYN ?          ?Advanced directive: ?Does Patient Have a Medical Advance Directive?: No ?Would patient like information on creating a medical advance directive?: Yes (ED - Information included in AVS) ? ?Depression screen:  See questionnaire below.  ? ?  04/23/2021  ? 10:54 AM 04/18/2020  ? 11:54 AM 07/30/2019   ?  8:40 AM 05/16/2015  ?  1:35 PM 11/30/2014  ? 10:18 AM  ?Depression screen PHQ 2/9  ?Decreased Interest 0 0 0 0 0  ?Down, Depressed, Hopeless 0 0 0 0 0  ?PHQ - 2 Score 0 0 0 0 0  ? ? ?Fall Risk Screen: see questionnaire below. ? ?  04/29/2021  ?  3:24 PM 04/23/2021  ? 10:54 AM 04/18/2020  ? 11:54 AM  ?Fall Risk   ?Falls in the past year? 0 0 1  ?Number falls in past yr: 0 0 0  ?Injury with Fall? 0 0 1  ?Risk for fall due to : No Fall Risks No Fall Risks Other (Comment)  ?Follow up Falls evaluation completed Falls evaluation completed   ? ? ?ADL screen:  See questionnaire below ?Functional Status Survey: ?Is the patient deaf or have difficulty hearing?: No ?Does the patient have difficulty seeing, even when wearing glasses/contacts?: No ?Does the patient have difficulty concentrating, remembering, or making decisions?: No ?Does the patient have difficulty walking or climbing stairs?: No ?Does the patient have difficulty dressing or bathing?: No ?Does the patient have difficulty doing errands alone such as visiting a doctor's office or shopping?: No ? ? ?Review of Systems ? ?General Appearance: Alert, cooperative, no distress, Lymph nodes: Cervical, supraclavicular, and axillary nodes normal ?Neurologic:  CNII-XII intact, normal strength, sensation and gait; reflexes 2+ and symmetric throughout ?Psych: Normal mood, affect, hygiene and grooming. ? ?ASSESSMENT/PLAN: ?Osteoporosis without current pathological fracture, unspecified osteoporosis type ? ?History of colonic polyps ? ?Immunization refused ? ? ? goals of calcium and vitamin  D intake  Immunization recommendations discussed.  Colonoscopy recommendations reviewed ? ? ?Medicare Attestation ?I have personally reviewed: ?The patient's medical and social history ?Their use of alcohol, tobacco or illicit drugs ?Their current medications and supplements ?The patient's functional ability including ADLs,fall risks, home safety risks, cognitive, and hearing and visual  impairment ?Diet and physical activities ?Evidence for depression or mood disorders ? ?The patient's weight, height, and BMI have been recorded in the chart.  I have made referrals, counseling, and provided education to the patient based on review of the above and I have provided the patient with a written personalized care plan for preventive services.   ? ? ?Jill Alexanders, MD   04/29/2021   ?

## 2021-05-06 ENCOUNTER — Ambulatory Visit (INDEPENDENT_AMBULATORY_CARE_PROVIDER_SITE_OTHER): Payer: Medicare Other | Admitting: Obstetrics and Gynecology

## 2021-05-06 ENCOUNTER — Encounter: Payer: Self-pay | Admitting: Obstetrics and Gynecology

## 2021-05-06 VITALS — BP 128/82 | HR 110 | Ht 61.5 in | Wt 133.0 lb

## 2021-05-06 DIAGNOSIS — Z5181 Encounter for therapeutic drug level monitoring: Secondary | ICD-10-CM | POA: Diagnosis not present

## 2021-05-06 DIAGNOSIS — M810 Age-related osteoporosis without current pathological fracture: Secondary | ICD-10-CM | POA: Diagnosis not present

## 2021-05-06 DIAGNOSIS — N952 Postmenopausal atrophic vaginitis: Secondary | ICD-10-CM

## 2021-05-06 MED ORDER — ESTRADIOL 0.1 MG/GM VA CREA: TOPICAL_CREAM | VAGINAL | 2 refills | Status: DC

## 2021-05-06 NOTE — Patient Instructions (Signed)

## 2021-05-14 DIAGNOSIS — C44319 Basal cell carcinoma of skin of other parts of face: Secondary | ICD-10-CM | POA: Diagnosis not present

## 2021-05-14 DIAGNOSIS — D485 Neoplasm of uncertain behavior of skin: Secondary | ICD-10-CM | POA: Diagnosis not present

## 2021-07-22 DIAGNOSIS — Z780 Asymptomatic menopausal state: Secondary | ICD-10-CM | POA: Diagnosis not present

## 2021-07-22 DIAGNOSIS — M810 Age-related osteoporosis without current pathological fracture: Secondary | ICD-10-CM | POA: Diagnosis not present

## 2021-07-22 DIAGNOSIS — Z1231 Encounter for screening mammogram for malignant neoplasm of breast: Secondary | ICD-10-CM | POA: Diagnosis not present

## 2021-07-22 DIAGNOSIS — M8589 Other specified disorders of bone density and structure, multiple sites: Secondary | ICD-10-CM | POA: Diagnosis not present

## 2021-07-22 LAB — HM DEXA SCAN

## 2021-07-22 LAB — HM MAMMOGRAPHY

## 2021-07-23 ENCOUNTER — Encounter: Payer: Self-pay | Admitting: Obstetrics and Gynecology

## 2021-07-27 ENCOUNTER — Telehealth: Payer: Self-pay

## 2021-07-27 NOTE — Telephone Encounter (Signed)
Called pt to advised of bone density results are the same and per JCL to continue to take her medication. Phone was not working properly.  Renelda Loma RMA

## 2021-07-29 DIAGNOSIS — C44311 Basal cell carcinoma of skin of nose: Secondary | ICD-10-CM | POA: Diagnosis not present

## 2021-08-20 DIAGNOSIS — Z85828 Personal history of other malignant neoplasm of skin: Secondary | ICD-10-CM | POA: Diagnosis not present

## 2021-08-20 DIAGNOSIS — L814 Other melanin hyperpigmentation: Secondary | ICD-10-CM | POA: Diagnosis not present

## 2021-08-20 DIAGNOSIS — D225 Melanocytic nevi of trunk: Secondary | ICD-10-CM | POA: Diagnosis not present

## 2021-08-20 DIAGNOSIS — Z08 Encounter for follow-up examination after completed treatment for malignant neoplasm: Secondary | ICD-10-CM | POA: Diagnosis not present

## 2021-08-24 NOTE — Progress Notes (Unsigned)
GYNECOLOGY  VISIT   HPI: 66 y.o.   Married  Caucasian  female   [phone] with Patient's last menstrual period was 11/01/2008.   here to discuss BMD results.   T score right femur: -2.6. T score left femur: -2.4. T score of spine: -2.2.  PTH 34 and calcium 9.5 on 07/13/17.  Took Actonel in the past and felt sick and had nausea.   States her PCP suggested Prolia for her.   She has some gum recession.  No surgical procedures planned.   Recent dx of basal cell cancer on her nose.   GYNECOLOGIC HISTORY: Patient's last menstrual period was 11/01/2008. Contraception:  Hyst Menopausal hormone therapy:  Estrace vaginal cream Last mammogram:  07-23-21 Neg/Birads1 Last pap smear:  04-30-20 Neg        OB History     Gravida  3   Para  3   Term  3   Preterm  0   AB  0   Living  3      SAB  0   IAB  0   Ectopic  0   Multiple  0   Live Births  3              Patient Active Problem List   Diagnosis Date Noted   History of colonic polyps 04/29/2021   Immunization refused 04/29/2021   Osteoporosis without current pathological fracture 07/30/2019   Injury of left foot including toes 05/21/2015   Vaginal erosion due to surgical mesh ([place]) 04/09/2013    Past Medical History:  Diagnosis Date   Facial basal cell cancer 2023   GERD (gastroesophageal reflux disease)    occasional tums   Grade III hemorrhoids    History of adenomatous polyp of colon    Nocturia    Osteoporosis 2017   Vitamin D deficiency     Past Surgical History:  Procedure Laterality Date   CATARACT EXTRACTION W/ INTRAOCULAR LENS IMPLANT Bilateral    left 04/ 2016;  right 20085   COLONOSCOPY     last one 07-22-2017 by dr Collene Mares   CYST REMOVAL NECK  2002   benign   [place] N/A 08/29/2020   Procedure: HEMORRHOIDECTOMY;  Surgeon: Leighton Ruff, MD;  Location: [facility];  Service: General;  Laterality: N/A;   ROBOTIC ASSISTED LAPAROSCOPIC SACROCOLPOPEXY  07/01/2010    @ [place]  by dr .Yolonda Kida;  with mesh   ROBOTIC ASSISTED TOTAL HYSTERECTOMY  11/26/2008   '@[facility]'$    by dr Joan Flores    Current Outpatient Medications  Medication Sig Dispense Refill   Acetaminophen (TYLENOL) 325 MG CAPS as needed.     calcium carbonate (TUMS - DOSED IN MG ELEMENTAL CALCIUM) 500 MG chewable tablet Chew 1 tablet by mouth as needed.     Calcium Citrate-Vitamin D (EQ CALCIUM CITRATE+D PO) Take 1 tablet by mouth 2 (two) times daily. Calcium  '500mg'$ /  Vit D  1000iu     estradiol (ESTRACE VAGINAL) 0.1 MG/GM vaginal cream Place 1/2 gram per vagina at bedtime 2 - 3 times per week. 42.5 g 2   Multiple Vitamins-Minerals (CENTRUM SILVER ADULT 50+ PO) Take 1 tablet by mouth daily.     No current facility-administered medications for this visit.     ALLERGIES: Risedronate sodium and Sulfa antibiotics  Family History  Problem Relation Age of Onset   Hypertension Mother    Congestive Heart Failure Mother    Thyroid disease Mother    Osteoporosis Mother  Stroke Mother    Diabetes Mother    Lung cancer Father    Diabetes Sister    Thyroid disease Sister    Osteoporosis Sister    Thyroid disease Brother    Leukemia Sister    Thyroid disease Sister    Diabetes Sister    Kidney disease Sister    Scleroderma Sister    Thyroid disease Brother    Rashes / Skin problems Other     Social History   Socioeconomic History   Marital status: Married    Spouse name: Not on file   Number of children: Not on file   Years of education: Not on file   Highest education level: Not on file  Occupational History   Not on file  Tobacco Use   Smoking status: Never   Smokeless tobacco: Never  Vaping Use   Vaping Use: Never used  Substance and Sexual Activity   Alcohol use: No    Alcohol/week: 0.0 standard drinks of alcohol   Drug use: Never   Sexual activity: Yes    Partners: Male    Birth control/protection: Surgical    Comment: R-[facility]--still has ovaries, >15 at first IC, <5 partners.   Other Topics Concern   Not on file  Social History Narrative   Not on file   Social Determinants of Health   Financial Resource Strain: Not on file  Food Insecurity: Not on file  Transportation Needs: Not on file  Physical Activity: Not on file  Stress: Not on file  Social Connections: Not on file  Intimate Partner Violence: Not on file    Review of Systems  All other systems reviewed and are negative.   PHYSICAL EXAMINATION:    BP 120/66   Ht 5' 1.5" (1.562 m)   Wt 133 lb (60.3 kg)   LMP 11/01/2008   BMI 24.72 kg/m     General appearance: alert, cooperative and appears stated age  ASSESSMENT  Osteoporosis.   Intolerance to Actonel.  FH stroke.   PLAN  We reviewed her bone density and increased risk of fracture.  Options for care reviewed:  Evista, Reclast, and Prolia.   We both agreed that Evista is not desirable for her to take due to potential stroke risk.  Written information on Reclast and Prolia.  Calcium 1200 mg daily and vit D at least 600 IU daily recommended.  Fall risk reduction reviewed.  Next BMD in 2 years.   An After Visit Summary was printed and given to the patient.  26 min  total time was spent for this patient encounter, including preparation, face-to-face counseling with the patient, coordination of care, and documentation of the encounter.

## 2021-08-26 ENCOUNTER — Encounter: Payer: Self-pay | Admitting: Obstetrics and Gynecology

## 2021-08-26 ENCOUNTER — Ambulatory Visit (INDEPENDENT_AMBULATORY_CARE_PROVIDER_SITE_OTHER): Payer: Medicare Other | Admitting: Obstetrics and Gynecology

## 2021-08-26 VITALS — BP 120/66 | Ht 61.5 in | Wt 133.0 lb

## 2021-08-26 DIAGNOSIS — M810 Age-related osteoporosis without current pathological fracture: Secondary | ICD-10-CM | POA: Diagnosis not present

## 2021-08-26 NOTE — Patient Instructions (Addendum)
Denosumab injection What is this medication? DENOSUMAB (den oh sue mab) slows bone breakdown. Prolia is used to treat osteoporosis in women after menopause and in men, and in people who are taking corticosteroids for 6 months or more. Xgeva is used to treat a high calcium level due to cancer and to prevent bone fractures and other bone problems caused by multiple myeloma or cancer bone metastases. Xgeva is also used to treat giant cell tumor of the bone. This medicine may be used for other purposes; ask your health care provider or pharmacist if you have questions. COMMON BRAND NAME(S): Prolia, XGEVA What should I tell my care team before I take this medication? They need to know if you have any of these conditions: dental disease having surgery or tooth extraction infection kidney disease low levels of calcium or Vitamin D in the blood malnutrition on hemodialysis skin conditions or sensitivity thyroid or parathyroid disease an unusual reaction to denosumab, other medicines, foods, dyes, or preservatives pregnant or trying to get pregnant breast-feeding How should I use this medication? This medicine is for injection under the skin. It is given by a health care professional in a hospital or clinic setting. A special MedGuide will be given to you before each treatment. Be sure to read this information carefully each time. For Prolia, talk to your pediatrician regarding the use of this medicine in children. Special care may be needed. For Xgeva, talk to your pediatrician regarding the use of this medicine in children. While this drug may be prescribed for children as young as 13 years for selected conditions, precautions do apply. Overdosage: If you think you have taken too much of this medicine contact a poison control center or emergency room at once. NOTE: This medicine is only for you. Do not share this medicine with others. What if I miss a dose? It is important not to miss your dose.  Call your doctor or health care professional if you are unable to keep an appointment. What may interact with this medication? Do not take this medicine with any of the following medications: other medicines containing denosumab This medicine may also interact with the following medications: medicines that lower your chance of fighting infection steroid medicines like prednisone or cortisone This list may not describe all possible interactions. Give your health care provider a list of all the medicines, herbs, non-prescription drugs, or dietary supplements you use. Also tell them if you smoke, drink alcohol, or use illegal drugs. Some items may interact with your medicine. What should I watch for while using this medication? Visit your doctor or health care professional for regular checks on your progress. Your doctor or health care professional may order blood tests and other tests to see how you are doing. Call your doctor or health care professional for advice if you get a fever, chills or sore throat, or other symptoms of a cold or flu. Do not treat yourself. This drug may decrease your body's ability to fight infection. Try to avoid being around people who are sick. You should make sure you get enough calcium and vitamin D while you are taking this medicine, unless your doctor tells you not to. Discuss the foods you eat and the vitamins you take with your health care professional. See your dentist regularly. Brush and floss your teeth as directed. Before you have any dental work done, tell your dentist you are receiving this medicine. Do not become pregnant while taking this medicine or for 5 months after   stopping it. Talk with your doctor or health care professional about your birth control options while taking this medicine. Women should inform their doctor if they wish to become pregnant or think they might be pregnant. There is a potential for serious side effects to an unborn child. Talk to  your health care professional or pharmacist for more information. What side effects may I notice from receiving this medication? Side effects that you should report to your doctor or health care professional as soon as possible: allergic reactions like skin rash, itching or hives, swelling of the face, lips, or tongue bone pain breathing problems dizziness jaw pain, especially after dental work redness, blistering, peeling of the skin signs and symptoms of infection like fever or chills; cough; sore throat; pain or trouble passing urine signs of low calcium like fast heartbeat, muscle cramps or muscle pain; pain, tingling, numbness in the hands or feet; seizures unusual bleeding or bruising unusually weak or tired Side effects that usually do not require medical attention (report to your doctor or health care professional if they continue or are bothersome): constipation diarrhea headache joint pain loss of appetite muscle pain runny nose tiredness upset stomach This list may not describe all possible side effects. Call your doctor for medical advice about side effects. You may report side effects to FDA at 1-800-FDA-1088. Where should I keep my medication? This medicine is only given in a clinic, doctor's office, or other health care setting and will not be stored at home. NOTE: This sheet is a summary. It may not cover all possible information. If you have questions about this medicine, talk to your doctor, pharmacist, or health care provider.  2023 Elsevier/Gold Standard (2017-05-27 00:00:00)   Zoledronic Acid Injection (Bone Disorders) What is this medication? ZOLEDRONIC ACID (ZOE le dron ik AS id) prevents and treats osteoporosis. It may also be used to treat Paget's disease of the bone. It works by Paramedic stronger and less likely to break (fracture). It belongs to a group of medications called bisphosphonates. This medicine may be used for other purposes; ask your  health care provider or pharmacist if you have questions. COMMON BRAND NAME(S): Reclast What should I tell my care team before I take this medication? They need to know if you have any of these conditions: Bleeding disorder Cancer Dental disease Kidney disease Low levels of calcium in the blood Low red blood cell counts Lung or breathing disease, such as asthma Receiving steroids, such as dexamethasone or prednisone An unusual or allergic reaction to zoledronic acid, other medications, foods, dyes, or preservatives Pregnant or trying to get pregnant Breast-feeding How should I use this medication? This medication is injected into a vein. It is given by your care team in a hospital or clinic setting. A special MedGuide will be given to you before each treatment. Be sure to read this information carefully each time. Talk to your care team about the use of this medication in children. Special care may be needed. Overdosage: If you think you have taken too much of this medicine contact a poison control center or emergency room at once. NOTE: This medicine is only for you. Do not share this medicine with others. What if I miss a dose? Keep appointments for follow-up doses. It is important not to miss your dose. Call your care team if you are unable to keep an appointment. What may interact with this medication? Certain antibiotics given by injection Medications for pain and inflammation, such as  ibuprofen, naproxen, NSAIDs Some diuretics, such as bumetanide, furosemide Teriparatide This list may not describe all possible interactions. Give your health care provider a list of all the medicines, herbs, non-prescription drugs, or dietary supplements you use. Also tell them if you smoke, drink alcohol, or use illegal drugs. Some items may interact with your medicine. What should I watch for while using this medication? Visit your care team for regular checks on your progress. It may be some time  before you see the benefit from this medication. Some people who take this medication have severe bone, joint, or muscle pain. This medication may also increase your risk for jaw problems or a broken thigh bone. Tell your care team right away if you have severe pain in your jaw, bones, joints, or muscles. Tell your care team if you have any pain that does not go away or that gets worse. You should make sure you get enough calcium and vitamin D while you are taking this medication. Discuss the foods you eat and the vitamins you take with your care team. You may need bloodwork while taking this medication. Tell your dentist and dental surgeon that you are taking this medication. You should not have major dental surgery while on this medication. See your dentist to have a dental exam and fix any dental problems before starting this medication. Take good care of your teeth while on this medication. Make sure you see your dentist for regular follow-up appointments. What side effects may I notice from receiving this medication? Side effects that you should report to your care team as soon as possible: Allergic reactions--skin rash, itching, hives, swelling of the face, lips, tongue, or throat Kidney injury--decrease in the amount of urine, swelling of the ankles, hands, or feet Low calcium level--muscle pain or cramps, confusion, tingling, or numbness in the hands or feet Osteonecrosis of the jaw--pain, swelling, or redness in the mouth, numbness of the jaw, poor healing after dental work, unusual discharge from the mouth, visible bones in the mouth Severe bone, joint, or muscle pain Side effects that usually do not require medical attention (report to your care team if they continue or are bothersome): Diarrhea Dizziness Headache Nausea Stomach pain Vomiting This list may not describe all possible side effects. Call your doctor for medical advice about side effects. You may report side effects to FDA  at 1-800-FDA-1088. Where should I keep my medication? This medication is given in a hospital or clinic. It will not be stored at home. NOTE: This sheet is a summary. It may not cover all possible information. If you have questions about this medicine, talk to your doctor, pharmacist, or health care provider.  2023 Elsevier/Gold Standard (2021-03-06 00:00:00)   Osteoporosis  Osteoporosis happens when the bones become thin and less dense than normal. Osteoporosis makes bones more brittle and fragile and more likely to break (fracture). Over time, osteoporosis can cause your bones to become so weak that they fracture after a minor fall. Bones in the hip, wrist, and spine are most likely to fracture due to osteoporosis. What are the causes? The exact cause of this condition is not known. What increases the risk? You are more likely to develop this condition if you: Have family members with this condition. Have poor nutrition. Use the following: Steroid medicines, such as prednisone. Anti-seizure medicines. Nicotine or tobacco, such as cigarettes, e-cigarettes, and chewing tobacco. Are female. Are age 40 or older. Are not physically active (are sedentary). Are of European or  Asian descent. Have a small body frame. What are the signs or symptoms? A fracture might be the first sign of osteoporosis, especially if the fracture results from a fall or injury that usually would not cause a bone to break. Other signs and symptoms include: Pain in the neck or low back. Stooped posture. Loss of height. How is this diagnosed? This condition may be diagnosed based on: Your medical history. A physical exam. A bone mineral density test, also called a DXA or DEXA test (dual-energy X-ray absorptiometry test). This test uses X-rays to measure the amount of minerals in your bones. How is this treated? This condition may be treated by: Making lifestyle changes, such as: Including foods with more  calcium and vitamin D in your diet. Doing weight-bearing and muscle-strengthening exercises. Stopping tobacco use. Limiting alcohol intake. Taking medicine to slow the process of bone loss or to increase bone density. Taking daily supplements of calcium and vitamin D. Taking hormone replacement medicines, such as estrogen for women and testosterone for men. Monitoring your levels of calcium and vitamin D. The goal of treatment is to strengthen your bones and lower your risk for a fracture. Follow these instructions at home: Eating and drinking Include calcium and vitamin D in your diet. Calcium is important for bone health, and vitamin D helps your body absorb calcium. Good sources of calcium and vitamin D include: Certain fatty fish, such as salmon and tuna. Products that have calcium and vitamin D added to them (are fortified), such as fortified cereals. Egg yolks. Cheese. Liver.  Activity Do exercises as told by your health care provider. Ask your health care provider what exercises and activities are safe for you. You should do: Exercises that make you work against gravity (weight-bearing exercises), such as tai chi, yoga, or walking. Exercises to strengthen muscles, such as lifting weights. Lifestyle Do not drink alcohol if: Your health care provider tells you not to drink. You are pregnant, may be pregnant, or are planning to become pregnant. If you drink alcohol: Limit how much you use to: 0-1 drink a day for women. 0-2 drinks a day for men. Know how much alcohol is in your drink. In the [place] one drink equals one 12 oz bottle of beer (355 mL), one 5 oz glass of wine (148 mL), or one 1 oz glass of hard liquor (44 mL). Do not use any products that contain nicotine or tobacco, such as cigarettes, e-cigarettes, and chewing tobacco. If you need help quitting, ask your health care provider. Preventing falls Use devices to help you move around (mobility aids) as needed, such as  canes, walkers, scooters, or crutches. Keep rooms well-lit and clutter-free. Remove tripping hazards from walkways, including cords and throw rugs. Install grab bars in bathrooms and safety rails on stairs. Use rubber mats in the bathroom and other areas that are often wet or slippery. Wear closed-toe shoes that fit well and support your feet. Wear shoes that have rubber soles or low heels. Review your medicines with your health care provider. Some medicines can cause dizziness or changes in blood pressure, which can increase your risk of falling. General instructions Take over-the-counter and prescription medicines only as told by your health care provider. Keep all follow-up visits. This is important. Contact a health care provider if: You have never been screened for osteoporosis and you are: A woman who is age 2 or older. A man who is age 54 or older. Get help right away if: You fall  or injure yourself. Summary Osteoporosis is thinning and loss of density in your bones. This makes bones more brittle and fragile and more likely to break (fracture),even with minor falls. The goal of treatment is to strengthen your bones and lower your risk for a fracture. Include calcium and vitamin D in your diet. Calcium is important for bone health, and vitamin D helps your body absorb calcium. Talk with your health care provider about screening for osteoporosis if you are a woman who is age 72 or older, or a man who is age 28 or older. This information is not intended to replace advice given to you by your health care provider. Make sure you discuss any questions you have with your health care provider. Document Revised: 07/05/2019 Document Reviewed: 07/05/2019 Elsevier Patient Education  [place].

## 2021-12-30 DIAGNOSIS — H524 Presbyopia: Secondary | ICD-10-CM | POA: Diagnosis not present

## 2022-02-24 DIAGNOSIS — L720 Epidermal cyst: Secondary | ICD-10-CM | POA: Diagnosis not present

## 2022-04-28 ENCOUNTER — Ambulatory Visit (INDEPENDENT_AMBULATORY_CARE_PROVIDER_SITE_OTHER): Payer: Medicare Other | Admitting: Family Medicine

## 2022-04-28 ENCOUNTER — Encounter: Payer: Self-pay | Admitting: Family Medicine

## 2022-04-28 VITALS — BP 138/84 | HR 75 | Temp 98.0°F | Resp 16 | Ht 61.75 in | Wt 135.6 lb

## 2022-04-28 DIAGNOSIS — M810 Age-related osteoporosis without current pathological fracture: Secondary | ICD-10-CM | POA: Diagnosis not present

## 2022-04-28 DIAGNOSIS — Z0000 Encounter for general adult medical examination without abnormal findings: Secondary | ICD-10-CM

## 2022-04-28 DIAGNOSIS — E559 Vitamin D deficiency, unspecified: Secondary | ICD-10-CM | POA: Diagnosis not present

## 2022-04-28 DIAGNOSIS — Z860100 Personal history of colon polyps, unspecified: Secondary | ICD-10-CM

## 2022-04-28 DIAGNOSIS — Z2821 Immunization not carried out because of patient refusal: Secondary | ICD-10-CM

## 2022-04-28 DIAGNOSIS — D126 Benign neoplasm of colon, unspecified: Secondary | ICD-10-CM

## 2022-04-28 DIAGNOSIS — E785 Hyperlipidemia, unspecified: Secondary | ICD-10-CM | POA: Diagnosis not present

## 2022-04-28 DIAGNOSIS — R748 Abnormal levels of other serum enzymes: Secondary | ICD-10-CM

## 2022-04-28 NOTE — Progress Notes (Signed)
Laura Baker is a 67 y.o. female who presents for annual wellness visit,CPE and follow-up on chronic medical conditions.  She has a history of osteoporosis and apparently was intolerant to bisphosphonates.  Her gynecologist talked to her about injections however if she is shot averse.  Presently she is on vitamin D and calcium.  She continues on Estrace.  Otherwise she is on OTC medications.  She does have a history of adenomatous colonic polyp and set up on the 5-year schedule for that.  Her immunizations were reviewed and she is not interested in shots.  She does not smoke or drink.  Home life is going well.  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  Immunizations and Health Maintenance Immunization History  Administered Date(s) Administered   Influenza,inj,Quad PF,6+ Mos 12/27/2012, 12/04/2016, 12/03/2017, 12/08/2019, 12/06/2020   Influenza-Unspecified 01/29/2016, 12/08/2019   PFIZER(Purple Top)SARS-COV-2 Vaccination 08/02/2019, 08/23/2019   Tdap 02/02/2008, 04/21/2018   Health Maintenance Due  Topic Date Due   Zoster Vaccines- Shingrix (1 of 2) Never done   Pneumonia Vaccine 54+ Years old (1 of 1 - PCV) Never done   COVID-19 Vaccine (3 - 2023-24 season) 10/02/2021       Depression screen:  See questionnaire below.     04/28/2022    9:18 AM 04/23/2021   10:54 AM 04/18/2020   11:54 AM 07/30/2019    8:40 AM 05/16/2015    1:35 PM  Depression screen PHQ 2/9  Decreased Interest 0 0 0 0 0  Down, Depressed, Hopeless 0 0 0 0 0  PHQ - 2 Score 0 0 0 0 0    Fall Risk Screen: see questionnaire below.    04/28/2022    9:18 AM 04/29/2021    3:24 PM 04/23/2021   10:54 AM 04/18/2020   11:54 AM  Fall Risk   Falls in the past year? 0 0 0 1  Number falls in past yr: 0 0 0 0  Injury with Fall? 0 0 0 1  Risk for fall due to : No Fall Risks No Fall Risks No Fall Risks Other (Comment)  Follow up Falls evaluation completed Falls evaluation completed Falls evaluation completed      ADL screen:  See questionnaire below Functional Status Survey: Is the patient deaf or have difficulty hearing?: No Does the patient have difficulty seeing, even when wearing glasses/contacts?: No Does the patient have difficulty concentrating, remembering, or making decisions?: No Does the patient have difficulty walking or climbing stairs?: No Does the patient have difficulty dressing or bathing?: No Does the patient have difficulty doing errands alone such as visiting a doctor's office or shopping?: No   Review of Systems Constitutional: -, -unexpected weight change, -anorexia, -fatigue Allergy: -sneezing, -itching, -congestion Dermatology: denies changing moles, rash, lumps ENT: -runny nose, -ear pain, -sore throat,  Cardiology:  -chest pain, -palpitations, -orthopnea, Respiratory: -cough, -shortness of breath, -dyspnea on exertion, -wheezing,  Gastroenterology: -abdominal pain, -nausea, -vomiting, -diarrhea, -constipation, -dysphagia Hematology: -bleeding or bruising problems Musculoskeletal: -arthralgias, -myalgias, -joint swelling, -back pain, - Ophthalmology: -vision changes,  Urology: -dysuria, -difficulty urinating,  -urinary frequency, -urgency, incontinence Neurology: -, -numbness, , -memory loss, -falls, -dizziness    PHYSICAL EXAM:  BP 138/84   Pulse 75   Temp 98 F (36.7 C) (Oral)   Resp 16   Ht 5' 1.75" (1.568 m)   Wt 135 lb 9.6 oz (61.5 kg)   LMP 11/01/2008   SpO2 98% Comment: room air  BMI 25.00 kg/m  General Appearance: Alert, cooperative, no distress, appears stated age Head: Normocephalic, without obvious abnormality, atraumatic Eyes: PERRL, conjunctiva/corneas clear, EOM's intact, Ears: Normal TM's and external ear canals Nose: Nares normal, mucosa normal, no drainage or sinus tenderness Throat: Lips, mucosa, and tongue normal; teeth and gums normal Neck: Supple, no lymphadenopathy;  thyroid:  no enlargement/tenderness/nodules; no carotid  bruit or JVD Lungs: Clear to auscultation bilaterally without wheezes, rales or ronchi; respirations unlabored Heart: Regular rate and rhythm, S1 and S2 normal, no murmur, rubor gallop Abdomen: Soft, non-tender, nondistended, normoactive bowel sounds,  no masses, no hepatosplenomegaly Extremities: No clubbing, cyanosis or edema Pulses: 2+ and symmetric all extremities Skin:  Skin color, texture, turgor normal, no rashes or lesions Lymph nodes: Cervical, supraclavicular, and axillary nodes normal Neurologic:  CNII-XII intact, normal strength, sensation and gait; reflexes 2+ and symmetric throughout Psych: Normal mood, affect, hygiene and grooming.  ASSESSMENT/PLAN: Routine general medical examination at a health care facility  Osteoporosis without current pathological fracture, unspecified osteoporosis type - Plan: CBC with Differential/Platelet, Comprehensive metabolic panel  History of colonic polyps  Hyperlipidemia, unspecified hyperlipidemia type - Plan: Lipid panel  Vitamin D insufficiency - Plan: VITAMIN D 25 Hydroxy (Vit-D Deficiency, Fractures)  Immunization refused  Adenomatous polyp of colon, unspecified part of colon    Discussed goals of calcium and vitamin D intake Immunization recommendations discussed.  Colonoscopy recommendations reviewed.  Recommend rediscussion of possible use of Prolia with gynecologist.   Medicare Attestation I have personally reviewed: The patient's medical and social history Their use of alcohol, tobacco or illicit drugs Their current medications and supplements The patient's functional ability including ADLs,fall risks, home safety risks, cognitive, and hearing and visual impairment Diet and physical activities Evidence for depression or mood disorders  The patient's weight, height, and BMI have been recorded in the chart.  I have made referrals, counseling, and provided education to the patient based on review of the above and I have  provided the patient with a written personalized care plan for preventive services.     Jill Alexanders, MD   04/28/2022

## 2022-04-29 LAB — COMPREHENSIVE METABOLIC PANEL
ALT: 93 IU/L — ABNORMAL HIGH (ref 0–32)
AST: 71 IU/L — ABNORMAL HIGH (ref 0–40)
Albumin/Globulin Ratio: 1.4 (ref 1.2–2.2)
Albumin: 4.4 g/dL (ref 3.9–4.9)
Alkaline Phosphatase: 125 IU/L — ABNORMAL HIGH (ref 44–121)
BUN/Creatinine Ratio: 20 (ref 12–28)
BUN: 18 mg/dL (ref 8–27)
Bilirubin Total: 0.7 mg/dL (ref 0.0–1.2)
CO2: 24 mmol/L (ref 20–29)
Calcium: 9.6 mg/dL (ref 8.7–10.3)
Chloride: 101 mmol/L (ref 96–106)
Creatinine, Ser: 0.92 mg/dL (ref 0.57–1.00)
Globulin, Total: 3.1 g/dL (ref 1.5–4.5)
Glucose: 102 mg/dL — ABNORMAL HIGH (ref 70–99)
Potassium: 4.6 mmol/L (ref 3.5–5.2)
Sodium: 140 mmol/L (ref 134–144)
Total Protein: 7.5 g/dL (ref 6.0–8.5)
eGFR: 68 mL/min/{1.73_m2} (ref >59)

## 2022-04-29 LAB — CBC WITH DIFFERENTIAL/PLATELET
Basophils Absolute: 0.1 10*3/uL (ref 0.0–0.2)
Basos: 1 %
EOS (ABSOLUTE): 0.1 10*3/uL (ref 0.0–0.4)
Eos: 1 %
Hematocrit: 44.7 % (ref 34.0–46.6)
Hemoglobin: 15.5 g/dL (ref 11.1–15.9)
Immature Grans (Abs): 0 10*3/uL (ref 0.0–0.1)
Immature Granulocytes: 0 %
Lymphocytes Absolute: 1.1 10*3/uL (ref 0.7–3.1)
Lymphs: 18 %
MCH: 32.6 pg (ref 26.6–33.0)
MCHC: 34.7 g/dL (ref 31.5–35.7)
MCV: 94 fL (ref 79–97)
Monocytes Absolute: 0.4 10*3/uL (ref 0.1–0.9)
Monocytes: 7 %
Neutrophils Absolute: 4.3 10*3/uL (ref 1.4–7.0)
Neutrophils: 73 %
Platelets: 217 10*3/uL (ref 150–450)
RBC: 4.75 x10E6/uL (ref 3.77–5.28)
RDW: 12.3 % (ref 11.7–15.4)
WBC: 5.9 10*3/uL (ref 3.4–10.8)

## 2022-04-29 LAB — LIPID PANEL
Chol/HDL Ratio: 3.5 ratio (ref 0.0–4.4)
Cholesterol, Total: 199 mg/dL (ref 100–199)
HDL: 57 mg/dL (ref >39)
LDL Chol Calc (NIH): 119 mg/dL — ABNORMAL HIGH (ref 0–99)
Triglycerides: 132 mg/dL (ref 0–149)
VLDL Cholesterol Cal: 23 mg/dL (ref 5–40)

## 2022-04-29 LAB — VITAMIN D 25 HYDROXY (VIT D DEFICIENCY, FRACTURES): Vit D, 25-Hydroxy: 33 ng/mL (ref 30.0–100.0)

## 2022-04-29 NOTE — Addendum Note (Signed)
Addended by: Denita Lung on: 04/29/2022 12:54 PM   Modules accepted: Orders

## 2022-05-05 DIAGNOSIS — K080 Exfoliation of teeth due to systemic causes: Secondary | ICD-10-CM | POA: Diagnosis not present

## 2022-05-06 ENCOUNTER — Other Ambulatory Visit: Payer: Self-pay

## 2022-05-06 ENCOUNTER — Telehealth: Payer: Self-pay | Admitting: Family Medicine

## 2022-05-06 DIAGNOSIS — R748 Abnormal levels of other serum enzymes: Secondary | ICD-10-CM

## 2022-05-06 NOTE — Telephone Encounter (Signed)
Advised pt of same 

## 2022-05-06 NOTE — Telephone Encounter (Signed)
Pt went to dentist has tooth infection.  He gave her antibiotic.  She asked should she wait until after her labs to start her antibiotic. I told her to take antibiotic as dentist advised her to.  Do you want her to have her liver labs drawn once she has finished her Rx or come as scheduled on 05/12/22?

## 2022-05-12 ENCOUNTER — Other Ambulatory Visit: Payer: Medicare Other

## 2022-05-12 ENCOUNTER — Ambulatory Visit (INDEPENDENT_AMBULATORY_CARE_PROVIDER_SITE_OTHER): Payer: Medicare Other | Admitting: Family Medicine

## 2022-05-12 ENCOUNTER — Encounter: Payer: Self-pay | Admitting: Family Medicine

## 2022-05-12 VITALS — BP 146/90 | HR 75 | Wt 134.2 lb

## 2022-05-12 DIAGNOSIS — H9201 Otalgia, right ear: Secondary | ICD-10-CM

## 2022-05-12 DIAGNOSIS — R748 Abnormal levels of other serum enzymes: Secondary | ICD-10-CM

## 2022-05-12 NOTE — Progress Notes (Signed)
   Subjective:    Patient ID: Laura Baker, female    DOB: 15-Jun-1955, 67 y.o.   MRN: [id number]  HPI She is here for recheck on her abnormal liver enzymes.  Recent blood work was negative for hepatitis.  She does not drink.  She is on no medications that would affect this.  She does complain of right earache and a slight residual cough that has been there for several weeks.  No fever, chills, sore throat.   Review of Systems     Objective:   Physical Exam Alert and in no distress.  Both TMs and canals are normal.  Neck is supple without adenopathy.  Throat is clear.       Assessment & Plan:  Elevated liver enzymes - Plan: Comprehensive metabolic panel  Right ear pain Recommend Tylenol for her earache.  Follow-up on the enzymes pending blood work.

## 2022-05-12 NOTE — Progress Notes (Signed)
67 y.o. G26P3003 Married Caucasian female here for breast and pelvic exam.  Patient is followed for osteoporosis and vaginal atrophy.  Using vaginal estrogen cream twice a week.   hx low vit D.   Husband retired.  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   PCP:  Dr. Susann Givens  Patient's last menstrual period was 11/01/2008.           Sexually active: Yes.    The current method of family planning is status post hysterectomy.    Exercising: Yes.     Walking, treadmill Smoker:  no  Health Maintenance: Pap:  04/30/20 neg History of abnormal Pap:  no MMG:  07/23/21 Breast Density Cat C, BI-RADS CAT 1 Neg. Colonoscopy:  07/22/17 - Dr. Loreta Ave BMD:   07/23/21  Result  osteoporosis.  She declines treatment.  She has dental concerns.  TDaP:  04/21/18 Gardasil:   no HIV: 04/10/15 NR Hep C: 3/27/4 NR Screening Labs:  PCP   reports that she has never smoked. She has never used smokeless tobacco. She reports that she does not drink alcohol and does not use drugs.  Past Medical History:  Diagnosis Date   Facial basal cell cancer 2023   GERD (gastroesophageal reflux disease)    occasional tums   Grade III hemorrhoids    History of adenomatous polyp of colon    Nocturia    Osteoporosis 2017   Vitamin D deficiency     Past Surgical History:  Procedure Laterality Date   CATARACT EXTRACTION W/ INTRAOCULAR LENS IMPLANT Bilateral    left 04/ 2016;  right 20085   COLONOSCOPY     last one 07-22-2017 by dr Loreta Ave   CYST REMOVAL NECK  2002   benign   HEMORRHOID SURGERY N/A 08/29/2020   Procedure: HEMORRHOIDECTOMY;  Surgeon: Romie Levee, MD;  Location: [facility];  Service: General;  Laterality: N/A;   ROBOTIC ASSISTED LAPAROSCOPIC SACROCOLPOPEXY  07/01/2010   @ [facility]  by dr .Kathlen Brunswick;  with mesh   ROBOTIC ASSISTED TOTAL HYSTERECTOMY  11/26/2008   @[facility]    by dr Tresa Res    Current Outpatient Medications  Medication Sig Dispense Refill   Acetaminophen (TYLENOL) 325 MG CAPS as needed.      calcium carbonate (TUMS - DOSED IN MG ELEMENTAL CALCIUM) 500 MG chewable tablet Chew 1 tablet by mouth as needed.     Calcium Citrate-Vitamin D (EQ CALCIUM CITRATE+D PO) Take 1 tablet by mouth 2 (two) times daily. Calcium  500mg /  Vit D  1000iu     estradiol (ESTRACE VAGINAL) 0.1 MG/GM vaginal cream Place 1/2 gram per vagina at bedtime 2 - 3 times per week. 42.5 g 2   Multiple Vitamins-Minerals (CENTRUM SILVER ADULT 50+ PO) Take 1 tablet by mouth daily.     No current facility-administered medications for this visit.    Family History  Problem Relation Age of Onset   Hypertension Mother    Congestive Heart Failure Mother    Thyroid disease Mother    Osteoporosis Mother    Stroke Mother    Diabetes Mother    Lung cancer Father    Diabetes Sister    Thyroid disease Sister    Osteoporosis Sister    Thyroid disease Brother    Leukemia Sister    Thyroid disease Sister    Diabetes Sister    Kidney disease Sister    Scleroderma Sister    Thyroid disease Brother    Rashes / Skin problems Other     Review  of Systems  All other systems reviewed and are negative.   Exam:   BP 126/86 (BP Location: Right Arm, Patient Position: Sitting, Cuff Size: Normal)   Pulse 84   Ht 5' 1.5" (1.562 m)   Wt 133 lb (60.3 kg)   LMP 11/01/2008   SpO2 96%   BMI 24.72 kg/m     General appearance: alert, cooperative and appears stated age Head: normocephalic, without obvious abnormality, atraumatic Neck: no adenopathy, supple, symmetrical, trachea midline and thyroid normal to inspection and palpation Lungs: clear to auscultation bilaterally Breasts: normal appearance, no masses or tenderness, No nipple retraction or dimpling, No nipple discharge or bleeding, No axillary adenopathy Heart: regular rate and rhythm Abdomen: soft, non-tender; no masses, no organomegaly Extremities: extremities normal, atraumatic, no cyanosis or edema Skin: skin color, texture, turgor normal. No rashes or lesions Lymph  nodes: cervical, supraclavicular, and axillary nodes normal. Neurologic: grossly normal  Pelvic: External genitalia:  no lesions              No abnormal inguinal nodes palpated.              Urethra:  normal appearing urethra with no masses, tenderness or lesions              Bartholins and Skenes: normal                 Vagina: normal appearing vagina with normal color and discharge, small area of mesh erosion at vaginal apex 10 x 3 mm.               Cervix: absent              Pap taken: no Bimanual Exam:  Uterus: absent              Adnexa: no mass, fullness, tenderness              Rectal exam: yes.  Confirms.              Anus:  normal sphincter tone, no lesions  Chaperone was present for exam:  Warren Lacymily F, CMA  Assessment:   Well woman visit with gynecologic exam. Hx vaginal vault mesh erosion.  Asymptomatic.  Has consulted with the pelvic surgeon who did her surgery and she is doing observational management now for years along with vaginal estrogen treatment.  Vaginal atrophy. Encounter for medication monitoring.  Osteoporosis.   Intolerance to Actonel.  FH stroke.    Plan: Mammogram screening discussed. Self breast awareness reviewed. Pap and HR HPV not indicated. Guidelines for Calcium, Vitamin D, regular exercise program including cardiovascular and weight bearing exercise. Rx for vaginal estradiol cream 1/2 gram pv at hs 2 - 3 times per week.  I discussed potential effect on breast cancer.   Follow up annually and prn.   After visit summary provided.   20 min  total time was spent for this patient encounter, including preparation, face-to-face counseling with the patient, coordination of care, and documentation of the encounter in addition to doing the breast and pelvic exam.

## 2022-05-13 ENCOUNTER — Ambulatory Visit: Payer: Medicare Other | Admitting: Obstetrics and Gynecology

## 2022-05-13 LAB — COMPREHENSIVE METABOLIC PANEL
ALT: 28 IU/L (ref 0–32)
AST: 31 IU/L (ref 0–40)
Albumin/Globulin Ratio: 1.5 (ref 1.2–2.2)
Albumin: 4.5 g/dL (ref 3.9–4.9)
Alkaline Phosphatase: 100 IU/L (ref 44–121)
BUN/Creatinine Ratio: 14 (ref 12–28)
BUN: 13 mg/dL (ref 8–27)
Bilirubin Total: 0.7 mg/dL (ref 0.0–1.2)
CO2: 23 mmol/L (ref 20–29)
Calcium: 9.9 mg/dL (ref 8.7–10.3)
Chloride: 102 mmol/L (ref 96–106)
Creatinine, Ser: 0.94 mg/dL (ref 0.57–1.00)
Globulin, Total: 3 g/dL (ref 1.5–4.5)
Glucose: 104 mg/dL — ABNORMAL HIGH (ref 70–99)
Potassium: 4.5 mmol/L (ref 3.5–5.2)
Sodium: 141 mmol/L (ref 134–144)
Total Protein: 7.5 g/dL (ref 6.0–8.5)
eGFR: 66 mL/min/{1.73_m2} (ref >59)

## 2022-05-26 ENCOUNTER — Ambulatory Visit (INDEPENDENT_AMBULATORY_CARE_PROVIDER_SITE_OTHER): Payer: Medicare Other | Admitting: Obstetrics and Gynecology

## 2022-05-26 ENCOUNTER — Encounter: Payer: Self-pay | Admitting: Obstetrics and Gynecology

## 2022-05-26 VITALS — BP 126/86 | HR 84 | Ht 61.5 in | Wt 133.0 lb

## 2022-05-26 DIAGNOSIS — T83711D Erosion of implanted vaginal mesh and other prosthetic materials to surrounding organ or tissue, subsequent encounter: Secondary | ICD-10-CM | POA: Diagnosis not present

## 2022-05-26 DIAGNOSIS — Z5181 Encounter for therapeutic drug level monitoring: Secondary | ICD-10-CM

## 2022-05-26 DIAGNOSIS — Z01419 Encounter for gynecological examination (general) (routine) without abnormal findings: Secondary | ICD-10-CM

## 2022-05-26 DIAGNOSIS — N952 Postmenopausal atrophic vaginitis: Secondary | ICD-10-CM | POA: Diagnosis not present

## 2022-05-26 MED ORDER — ESTRADIOL 0.1 MG/GM VA CREA: TOPICAL_CREAM | VAGINAL | 2 refills | Status: DC

## 2022-05-26 NOTE — Patient Instructions (Signed)

## 2022-05-27 LAB — ACUTE HEP PANEL AND HEP B SURFACE AB
Hep A IgM: NEGATIVE
Hep B C IgM: NEGATIVE
Hep C Virus Ab: NONREACTIVE
Hepatitis B Surf Ab Quant: <3.5 m[IU]/mL — ABNORMAL LOW (ref >9.9)
Hepatitis B Surface Ag: NEGATIVE

## 2022-05-27 LAB — SPECIMEN STATUS REPORT

## 2022-06-16 DIAGNOSIS — L821 Other seborrheic keratosis: Secondary | ICD-10-CM | POA: Diagnosis not present

## 2022-06-16 DIAGNOSIS — L814 Other melanin hyperpigmentation: Secondary | ICD-10-CM | POA: Diagnosis not present

## 2022-06-16 DIAGNOSIS — D225 Melanocytic nevi of trunk: Secondary | ICD-10-CM | POA: Diagnosis not present

## 2022-06-16 DIAGNOSIS — L720 Epidermal cyst: Secondary | ICD-10-CM | POA: Diagnosis not present

## 2022-07-22 DIAGNOSIS — K080 Exfoliation of teeth due to systemic causes: Secondary | ICD-10-CM | POA: Diagnosis not present

## 2022-07-28 DIAGNOSIS — Z1231 Encounter for screening mammogram for malignant neoplasm of breast: Secondary | ICD-10-CM | POA: Diagnosis not present

## 2022-07-30 ENCOUNTER — Encounter: Payer: Self-pay | Admitting: Obstetrics and Gynecology

## 2022-08-12 DIAGNOSIS — Z1211 Encounter for screening for malignant neoplasm of colon: Secondary | ICD-10-CM | POA: Diagnosis not present

## 2022-08-12 DIAGNOSIS — Z86010 Personal history of colonic polyps: Secondary | ICD-10-CM | POA: Diagnosis not present

## 2022-09-01 DIAGNOSIS — Z1211 Encounter for screening for malignant neoplasm of colon: Secondary | ICD-10-CM | POA: Diagnosis not present

## 2022-09-01 DIAGNOSIS — K5730 Diverticulosis of large intestine without perforation or abscess without bleeding: Secondary | ICD-10-CM | POA: Diagnosis not present

## 2022-09-01 DIAGNOSIS — D123 Benign neoplasm of transverse colon: Secondary | ICD-10-CM | POA: Diagnosis not present

## 2022-09-01 DIAGNOSIS — Z86010 Personal history of colonic polyps: Secondary | ICD-10-CM | POA: Diagnosis not present

## 2022-09-01 DIAGNOSIS — K635 Polyp of colon: Secondary | ICD-10-CM | POA: Diagnosis not present

## 2022-09-03 DIAGNOSIS — D369 Benign neoplasm, unspecified site: Secondary | ICD-10-CM

## 2022-09-03 HISTORY — DX: Benign neoplasm, unspecified site: D36.9

## 2022-09-03 LAB — HM COLONOSCOPY

## 2022-09-07 ENCOUNTER — Ambulatory Visit: Payer: Medicare Other | Admitting: Family Medicine

## 2022-09-07 ENCOUNTER — Encounter: Payer: Self-pay | Admitting: Family Medicine

## 2022-09-07 VITALS — BP 130/72 | HR 76 | Ht 61.5 in | Wt 133.4 lb

## 2022-09-07 DIAGNOSIS — R001 Bradycardia, unspecified: Secondary | ICD-10-CM

## 2022-09-07 NOTE — Addendum Note (Signed)
Addended by: Herminio Commons A on: 09/07/2022 03:56 PM   Modules accepted: Orders

## 2022-09-07 NOTE — Progress Notes (Signed)
   Subjective:    Patient ID: Laura Baker, female    DOB: 06-Jan-1956, 67 y.o.   MRN: [id number]  HPI She is here for consult concerning her recent colonoscopy with a heart rate did drop down to 20.  Apparently she rebounded from that nicely.  She has no history of chest pain, shortness of breath, PND, heart rate changes or previous history of this.   Review of Systems     Objective:    Physical Exam Alert and in no distress.  Cardiac exam shows a regular rhythm without murmurs or gallops.  Lungs are clear to auscultation.  EKG read by me shows a rate of 74 with all other parameters being totally normal.       Assessment & Plan:  Bradycardia I explained that I thought the episode was mostly related.  The procedure and anesthesia and since she is really never has problems before I do not think further evaluation is really needed.  She was comfortable with that.

## 2022-09-08 ENCOUNTER — Encounter: Payer: Self-pay | Admitting: Internal Medicine

## 2022-09-24 IMAGING — CR DG FOOT COMPLETE 3+V*R*
3 series · 3 of 3 positions shown · non-contrast
Comparison: None.

CLINICAL DATA: Pain following rolling type injury

EXAM:
RIGHT FOOT COMPLETE - 3+ VIEW

[x foot ap right]
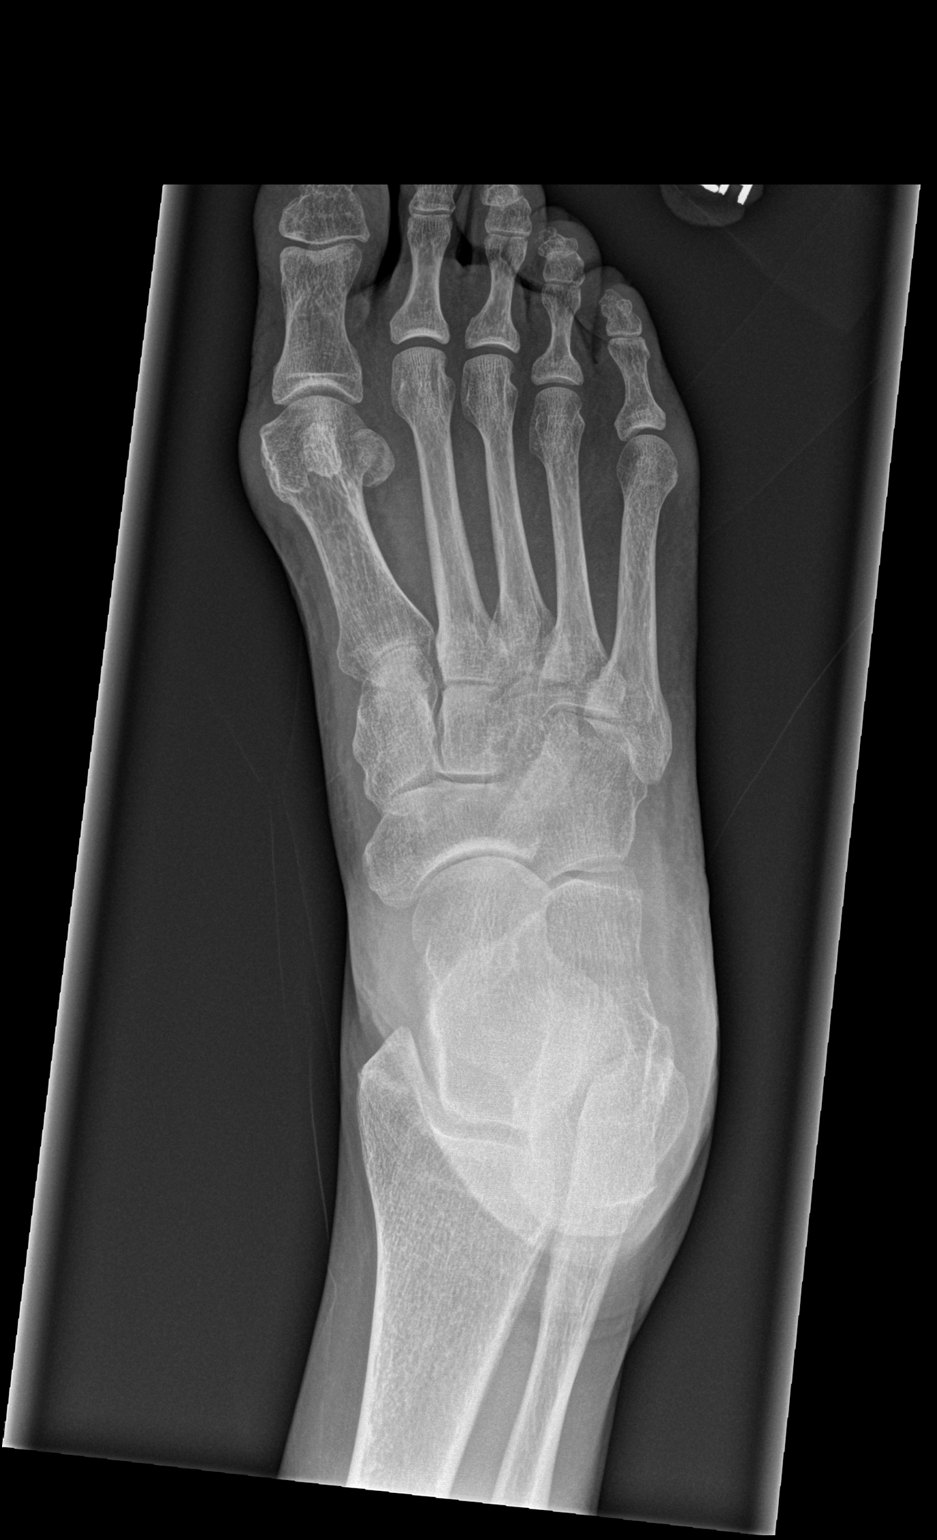

[x foot obl right]
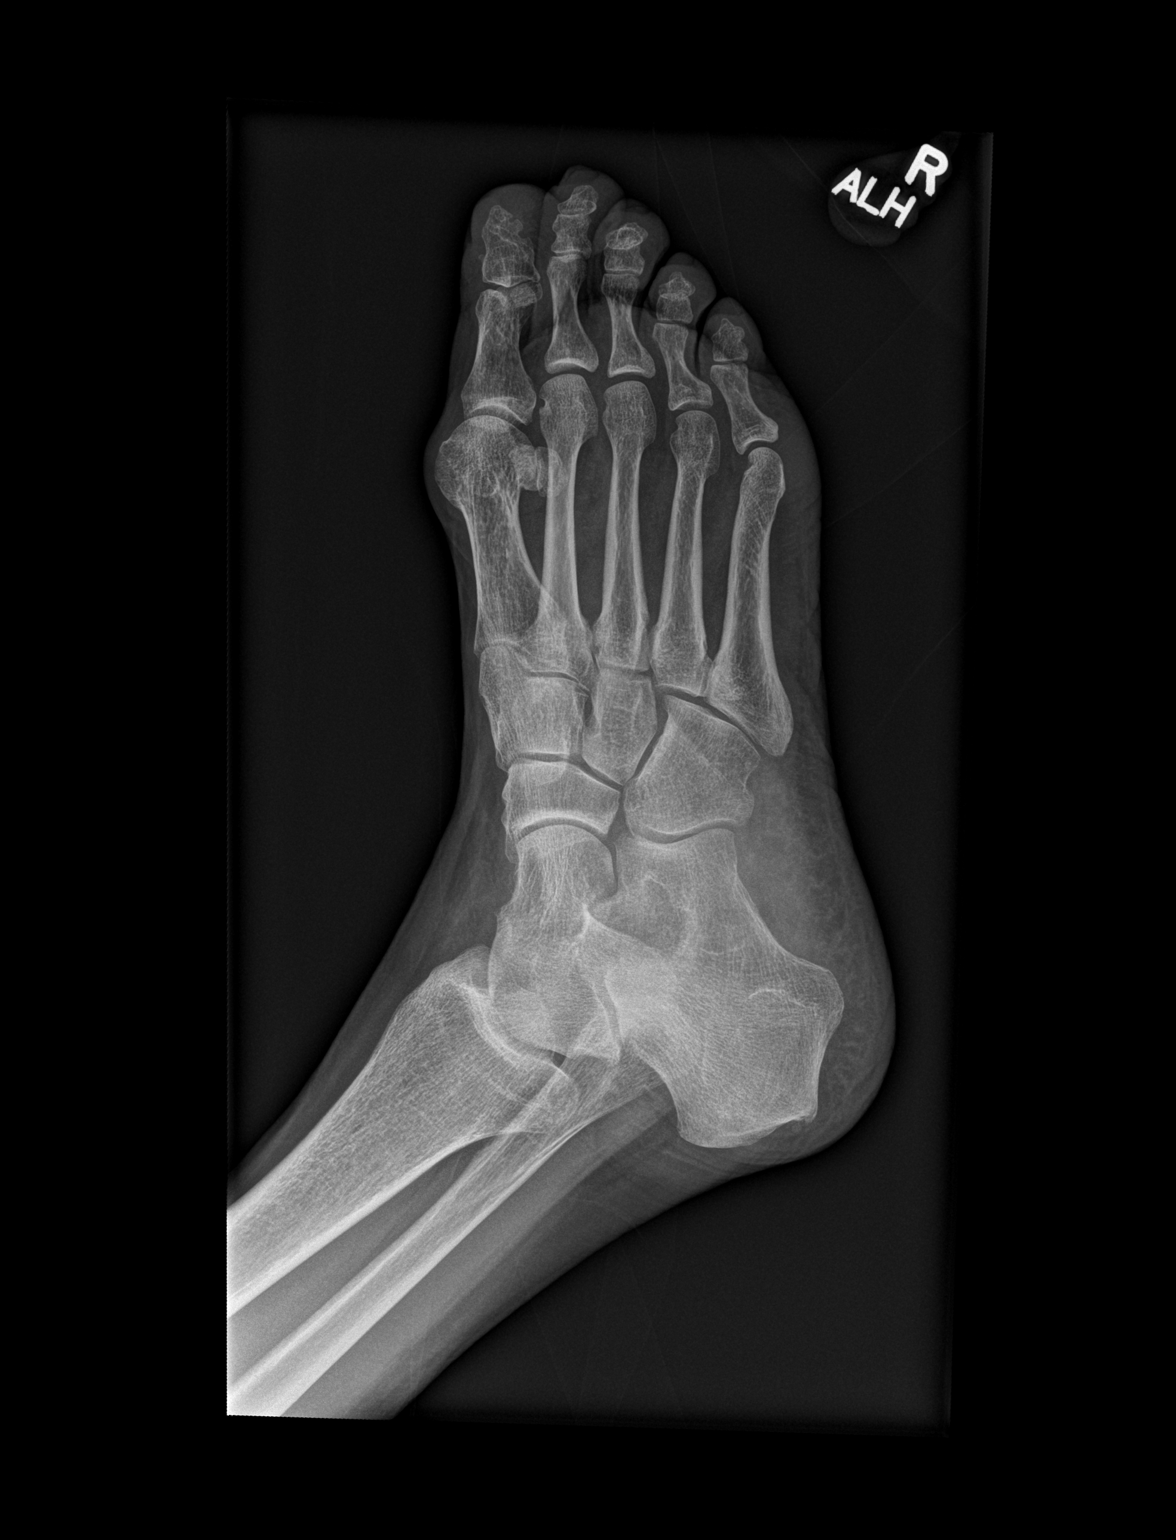

[x foot lat right]
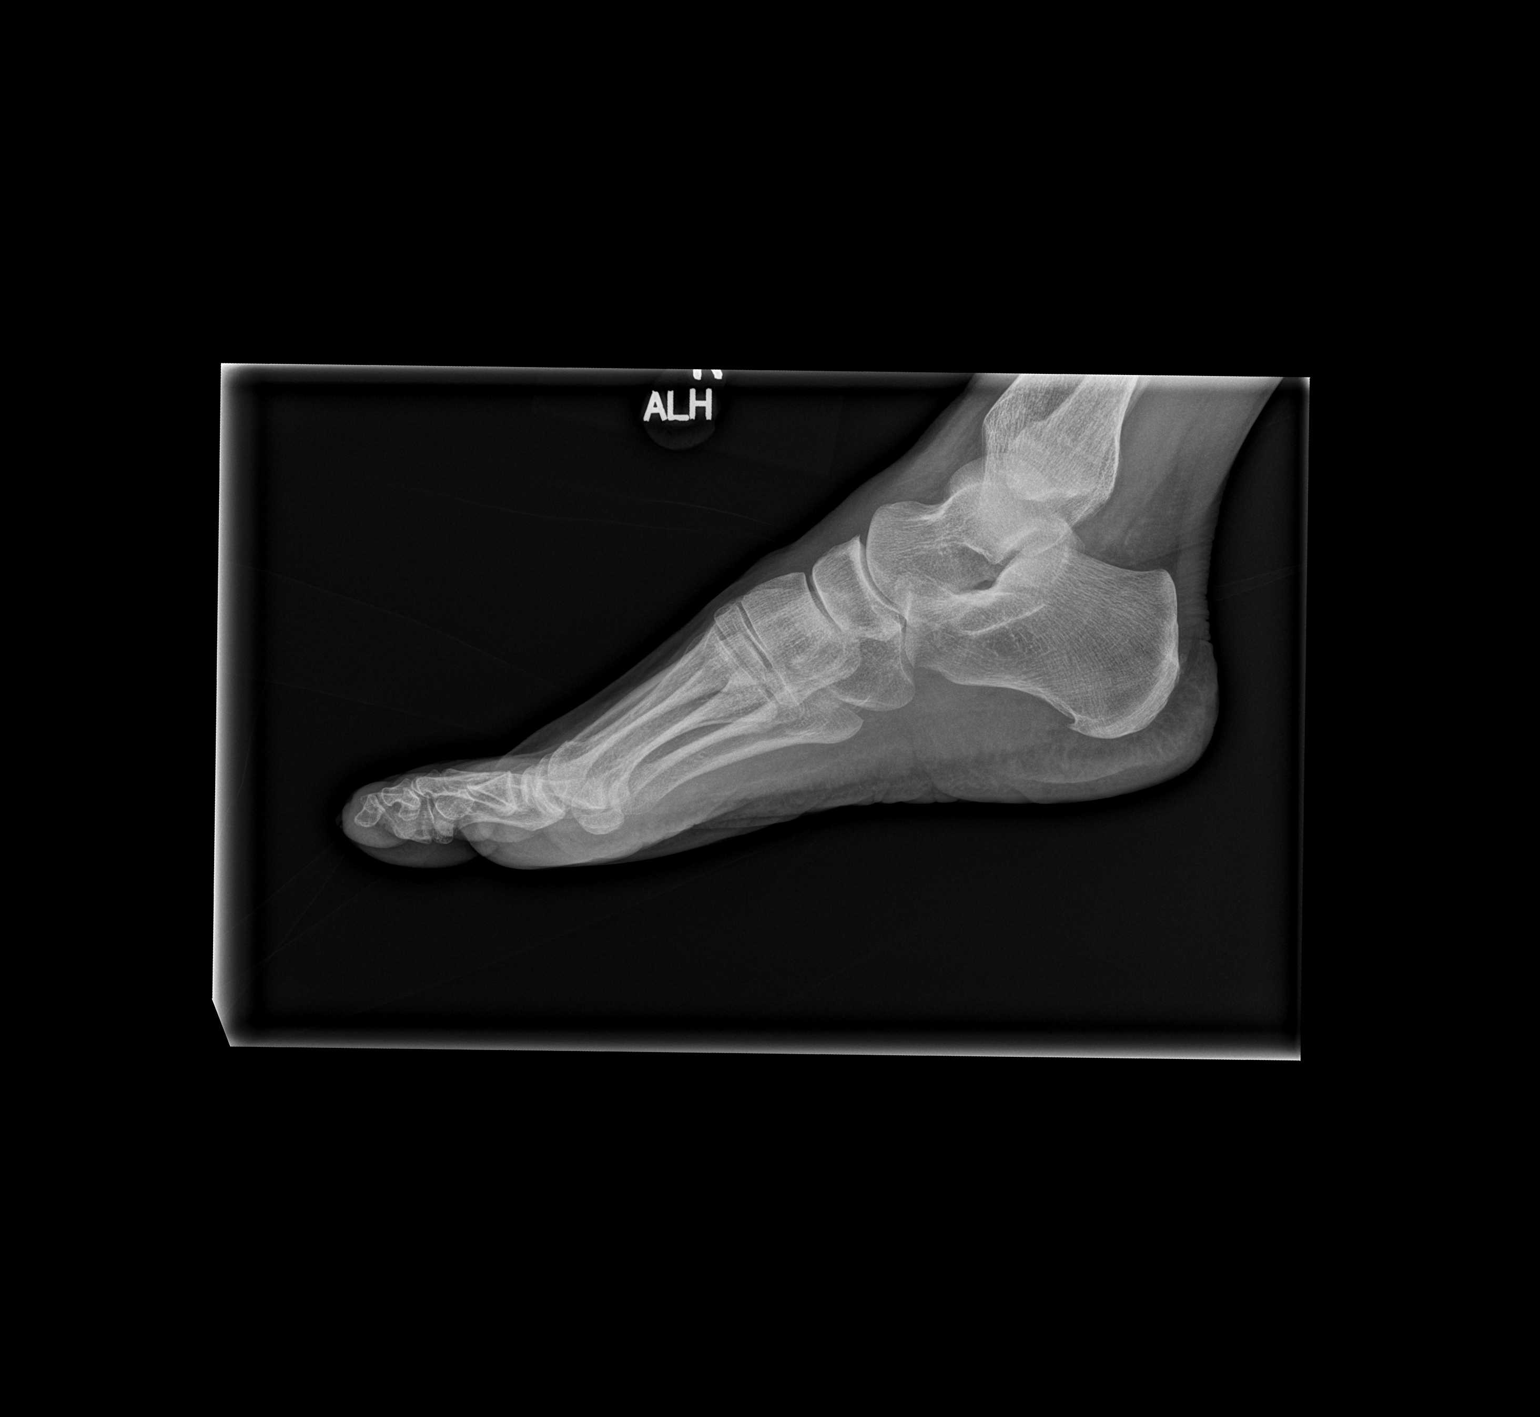

[3 of 3 positions shown; findings below may reference images not displayed]

FINDINGS: Frontal, oblique, and lateral views were obtained. There is no
fracture or dislocation. No appreciable joint space narrowing. Mild
bony overgrowth distal first metatarsal with early bunion formation.
No erosion.
IMPRESSION: Mild bony overgrowth distal first metatarsal with mild bunion
formation medially in this area. No fracture or dislocation. No
joint space narrowing or evident erosion.

## 2022-12-16 DIAGNOSIS — K080 Exfoliation of teeth due to systemic causes: Secondary | ICD-10-CM | POA: Diagnosis not present

## 2023-01-05 DIAGNOSIS — H524 Presbyopia: Secondary | ICD-10-CM | POA: Diagnosis not present

## 2023-05-11 ENCOUNTER — Encounter: Payer: Self-pay | Admitting: Family Medicine

## 2023-05-11 ENCOUNTER — Ambulatory Visit: Payer: Medicare Other | Admitting: Family Medicine

## 2023-05-11 VITALS — BP 124/82 | HR 84 | Ht 62.0 in | Wt 125.4 lb

## 2023-05-11 DIAGNOSIS — D126 Benign neoplasm of colon, unspecified: Secondary | ICD-10-CM | POA: Diagnosis not present

## 2023-05-11 DIAGNOSIS — Z2821 Immunization not carried out because of patient refusal: Secondary | ICD-10-CM | POA: Diagnosis not present

## 2023-05-11 DIAGNOSIS — Z13220 Encounter for screening for lipoid disorders: Secondary | ICD-10-CM | POA: Diagnosis not present

## 2023-05-11 DIAGNOSIS — Z0000 Encounter for general adult medical examination without abnormal findings: Secondary | ICD-10-CM | POA: Diagnosis not present

## 2023-05-11 DIAGNOSIS — Z860100 Personal history of colon polyps, unspecified: Secondary | ICD-10-CM | POA: Diagnosis not present

## 2023-05-11 DIAGNOSIS — M816 Localized osteoporosis [Lequesne]: Secondary | ICD-10-CM

## 2023-05-11 LAB — LIPID PANEL

## 2023-05-11 NOTE — Progress Notes (Signed)
 Complete physical exam  Patient: Laura Baker   DOB: Feb 12, 1955   68 y.o. Female  MRN: [id number]  Subjective:    Chief Complaint  Patient presents with   Annual Exam    Medicare well visit. CPE. Fasting.  Having pain in neck when she swallows. Been going on for 2 or 3 days.     Laura Baker is a 68 y.o. female who presents today for a complete physical exam.  She reports consuming a low sodium and low sugar  diet. Home exercise routine includes treadmill and walks with dog. She generally feels fairly well. She reports sleeping fairly well.  She recently had a colonoscopy which did show evidence of polyp and is scheduled for routine follow-up on that.  She does have osteoporosis but presently is on no medication and is not interested in any intervention at the present time.  She has refused immunizations in the past.  Otherwise she has no particular concerns or questions.  Does not smoke or drink.  Most recent fall risk assessment:    05/11/2023    9:20 AM  Fall Risk   Falls in the past year? 0  Number falls in past yr: 0  Risk for fall due to : History of fall(s)  Follow up Falls evaluation completed     Most recent depression screenings:    05/11/2023    9:21 AM 05/12/2022    8:57 AM  PHQ 2/9 Scores  PHQ - 2 Score 0 0    Vision:01/05/2023 and Dental: No current dental problems and Last dental visit: September 2024    Immunization History  Administered Date(s) Administered   Influenza,inj,Quad PF,6+ Mos 12/27/2012, 12/04/2016, 12/03/2017, 12/08/2019, 12/06/2020   Influenza-Unspecified 01/29/2016, 12/08/2019   PFIZER(Purple Top)SARS-COV-2 Vaccination 08/02/2019, 08/23/2019   Tdap 02/02/2008, 04/21/2018    Health Maintenance  Topic Date Due   Zoster Vaccines- Shingrix (1 of 2) Never done   Pneumonia Vaccine 37+ Years old (1 of 1 - PCV) Never done   COVID-19 Vaccine (3 - 2024-25 season) 10/03/2022   Medicare Annual Wellness (AWV)  04/28/2023   MAMMOGRAM   07/28/2023   INFLUENZA VACCINE  09/02/2023   DTaP/Tdap/Td (3 - Td or Tdap) 04/20/2028   Colonoscopy  09/02/2032   DEXA SCAN  Completed   Hepatitis C Screening  Completed   HPV VACCINES  Aged Out    Patient Care Team: Ronnald Nian, MD as PCP - General (Family Medicine) Ardell Isaacs, Forrestine Him, MD as Consulting Physician (Obstetrics and Gynecology)   Outpatient Medications Prior to Visit  Medication Sig   Calcium Citrate-Vitamin D (EQ CALCIUM CITRATE+D PO) Take 1 tablet by mouth 2 (two) times daily. Calcium  500mg /  Vit D  1000iu   estradiol (ESTRACE VAGINAL) 0.1 MG/GM vaginal cream Place 1/2 gram per vagina at bedtime 2 - 3 times per week.   Multiple Vitamins-Minerals (CENTRUM SILVER ADULT 50+ PO) Take 1 tablet by mouth daily.   Acetaminophen (TYLENOL) 325 MG CAPS as needed. (Patient not taking: Reported on 05/11/2023)   calcium carbonate (TUMS - DOSED IN MG ELEMENTAL CALCIUM) 500 MG chewable tablet Chew 1 tablet by mouth as needed. (Patient not taking: Reported on 09/07/2022)   No facility-administered medications prior to visit.    Review of Systems  All other systems reviewed and are negative.   Family and social history as well as health maintenance and immunizations was reviewed.     Objective:    BP 124/82  Pulse 84   Ht 5\' 2"  (1.575 m)   Wt 125 lb 6.4 oz (56.9 kg)   LMP 11/01/2008   SpO2 94%   BMI 22.94 kg/m    Physical Exam   No results found for any visits on 05/11/23.     Assessment & Plan:     Routine general medical examination at a health care facility  Adenomatous polyp of colon, unspecified part of colon - Plan: CBC with Differential/Platelet, Comprehensive metabolic panel with GFR  Immunization refused  History of colonic polyps  Localized osteoporosis without current pathological fracture  Screening for lipid disorders - Plan: Lipid panel  I again discussed immunizations with her including benefits and risks.  She is still not interested  in any immunization.  Also discussed osteoporosis treatment with her and again she is not interested in any pills or shots.  Apparently her gynecologist gave her a bisphosphonate that she did not tolerate. Return in about 1 year (around 05/10/2024).      Sharlot Gowda, MD

## 2023-05-12 LAB — COMPREHENSIVE METABOLIC PANEL WITH GFR
ALT: 17 IU/L (ref 0–32)
AST: 21 IU/L (ref 0–40)
Albumin: 4.5 g/dL (ref 3.9–4.9)
Alkaline Phosphatase: 94 IU/L (ref 44–121)
BUN/Creatinine Ratio: 17 (ref 12–28)
BUN: 15 mg/dL (ref 8–27)
Bilirubin Total: 0.8 mg/dL (ref 0.0–1.2)
CO2: 22 mmol/L (ref 20–29)
Calcium: 9.6 mg/dL (ref 8.7–10.3)
Chloride: 101 mmol/L (ref 96–106)
Creatinine, Ser: 0.86 mg/dL (ref 0.57–1.00)
Globulin, Total: 3.1 g/dL (ref 1.5–4.5)
Glucose: 104 mg/dL — ABNORMAL HIGH (ref 70–99)
Potassium: 4.5 mmol/L (ref 3.5–5.2)
Sodium: 141 mmol/L (ref 134–144)
Total Protein: 7.6 g/dL (ref 6.0–8.5)
eGFR: 73 mL/min/{1.73_m2} (ref >59)

## 2023-05-12 LAB — CBC WITH DIFFERENTIAL/PLATELET
Basophils Absolute: 0.1 10*3/uL (ref 0.0–0.2)
Basos: 1 %
EOS (ABSOLUTE): 0 10*3/uL (ref 0.0–0.4)
Eos: 0 %
Hematocrit: 45.4 % (ref 34.0–46.6)
Hemoglobin: 15.6 g/dL (ref 11.1–15.9)
Immature Grans (Abs): 0 10*3/uL (ref 0.0–0.1)
Immature Granulocytes: 0 %
Lymphocytes Absolute: 0.8 10*3/uL (ref 0.7–3.1)
Lymphs: 15 %
MCH: 32.2 pg (ref 26.6–33.0)
MCHC: 34.4 g/dL (ref 31.5–35.7)
MCV: 94 fL (ref 79–97)
Monocytes Absolute: 0.3 10*3/uL (ref 0.1–0.9)
Monocytes: 6 %
Neutrophils Absolute: 4.3 10*3/uL (ref 1.4–7.0)
Neutrophils: 78 %
Platelets: 255 10*3/uL (ref 150–450)
RBC: 4.84 x10E6/uL (ref 3.77–5.28)
RDW: 12 % (ref 11.7–15.4)
WBC: 5.5 10*3/uL (ref 3.4–10.8)

## 2023-05-12 LAB — LIPID PANEL
Cholesterol, Total: 202 mg/dL — ABNORMAL HIGH (ref 100–199)
HDL: 58 mg/dL (ref >39)
LDL CALC COMMENT:: 3.5 ratio (ref 0.0–4.4)
LDL Chol Calc (NIH): 126 mg/dL — ABNORMAL HIGH (ref 0–99)
Triglycerides: 102 mg/dL (ref 0–149)
VLDL Cholesterol Cal: 18 mg/dL (ref 5–40)

## 2023-05-19 NOTE — Progress Notes (Signed)
 68 y.o. G2P3003 Married Caucasian female here for breast and pelvic exam.  She is followed for vaginal atrophy and osteoporosis.  Using vaginal estrogen twice every two weeks.  No vaginal bleeding or pain.  No problems with intercourse.   Hx vaginal mesh erosion from prolapse surgical repair.   Uses Miralax as needed for constipation.   PCP: Watson Hacking, MD   Patient's last menstrual period was 11/01/2008.           Sexually active: Yes.    The current method of family planning is status post hysterectomy.    Menopausal hormone therapy:  Estrace   Exercising: Yes.     Walking Smoker:  no  OB History  Gravida Para Term Preterm AB Living  3 3 3  0 0 3  SAB IAB Ectopic Multiple Live Births  0 0 0 0 3    # Outcome Date GA Lbr Len/2nd Weight Sex Type Anes PTL Lv  3 Term         LIV  2 Term         LIV  1 Term         LIV     HEALTH MAINTENANCE: Last 2 paps:  04/30/20 neg History of abnormal Pap or positive HPV:  no Mammogram:   07/28/22 BIRADS Cat 1 neg  Colonoscopy:  09/03/22 Bone Density:  07/23/21  Result  osteoporosis right hip.  Osteopenia of left hip and spine.   Immunization History  Administered Date(s) Administered   Influenza,inj,Quad PF,6+ Mos 12/27/2012, 12/04/2016, 12/03/2017, 12/08/2019, 12/06/2020   Influenza-Unspecified 01/29/2016, 12/08/2019   PFIZER(Purple Top)SARS-COV-2 Vaccination 08/02/2019, 08/23/2019   Tdap 02/02/2008, 04/21/2018      reports that she has never smoked. She has never used smokeless tobacco. She reports that she does not drink alcohol and does not use drugs.  Past Medical History:  Diagnosis Date   Facial basal cell cancer 2023   GERD (gastroesophageal reflux disease)    occasional tums   Grade III hemorrhoids    History of adenomatous polyp of colon    Nocturia    Osteoporosis 2017   Tubular adenoma 09/03/2022   Vitamin D  deficiency     Past Surgical History:  Procedure Laterality Date   CATARACT EXTRACTION W/  INTRAOCULAR LENS IMPLANT Bilateral    left 04/ 2016;  right 20085   COLONOSCOPY     last one 07-22-2017 by dr Tova Fresh   CYST REMOVAL NECK  2002   benign   HEMORRHOID SURGERY N/A 08/29/2020   Procedure: HEMORRHOIDECTOMY;  Surgeon: Joyce Nixon, MD;  Location: [facility];  Service: General;  Laterality: N/A;   ROBOTIC ASSISTED LAPAROSCOPIC SACROCOLPOPEXY  07/01/2010   @ [facility]  by dr .Synetta Eves;  with mesh   ROBOTIC ASSISTED TOTAL HYSTERECTOMY  11/26/2008   @[facility]    by dr Denton Flakes    Current Outpatient Medications  Medication Sig Dispense Refill   Calcium Citrate-Vitamin D  (EQ CALCIUM CITRATE+D PO) Take 1 tablet by mouth 2 (two) times daily. Calcium  500mg /  Vit D  1000iu     estradiol  (ESTRACE  VAGINAL) 0.1 MG/GM vaginal cream Place 1/2 gram per vagina at bedtime 2 - 3 times per week. 42.5 g 2   Multiple Vitamins-Minerals (CENTRUM SILVER ADULT 50+ PO) Take 1 tablet by mouth daily.     Acetaminophen  (TYLENOL ) 325 MG CAPS as needed. (Patient not taking: Reported on 05/11/2023)     calcium carbonate (TUMS - DOSED IN MG ELEMENTAL CALCIUM) 500 MG chewable  tablet Chew 1 tablet by mouth as needed. (Patient not taking: Reported on 09/07/2022)     No current facility-administered medications for this visit.    ALLERGIES: Risedronate  sodium and Sulfa antibiotics  Family History  Problem Relation Age of Onset   Hypertension Mother    Congestive Heart Failure Mother    Thyroid  disease Mother    Osteoporosis Mother    Stroke Mother    Diabetes Mother    Lung cancer Father    Diabetes Sister    Thyroid  disease Sister    Osteoporosis Sister    Thyroid  disease Brother    Leukemia Sister    Thyroid  disease Sister    Diabetes Sister    Kidney disease Sister    Scleroderma Sister    Thyroid  disease Brother    Rashes / Skin problems Other     Review of Systems  All other systems reviewed and are negative.   PHYSICAL EXAM:  BP 126/82 (BP Location: Left Arm, Patient Position:  Sitting, Cuff Size: Normal)   Pulse 88   Ht 5' 2.5" (1.588 m)   Wt 135 lb (61.2 kg)   LMP 11/01/2008   SpO2 98%   BMI 24.30 kg/m     General appearance: alert, cooperative and appears stated age Head: normocephalic, without obvious abnormality, atraumatic Neck: no adenopathy, supple, symmetrical, trachea midline and thyroid  normal to inspection and palpation Lungs: clear to auscultation bilaterally Breasts: normal appearance, no masses or tenderness, No nipple retraction or dimpling, No nipple discharge or bleeding, No axillary adenopathy Heart: regular rate and rhythm Abdomen: soft, non-tender; no masses, no organomegaly Extremities: extremities normal, atraumatic, no cyanosis or edema Skin: skin color, texture, turgor normal. No rashes or lesions Lymph nodes: cervical, supraclavicular, and axillary nodes normal. Neurologic: grossly normal  Pelvic: External genitalia:  no lesions              No abnormal inguinal nodes palpated.              Urethra:  normal appearing urethra with no masses, tenderness or lesions              Bartholins and Skenes: normal                 Vagina: normal appearing vagina with normal color and discharge, no lesions.  First to second degree rectocele.               Cervix: absent              Pap taken: no Bimanual Exam:  Uterus:  absent.  Suture palpable at the vaginal apex.                Adnexa: no mass, fullness, tenderness              Rectal exam: yes.  Confirms.              Anus:  normal sphincter tone, no lesions  Chaperone was present for exam:   Cottie Diss, CMA  ASSESSMENT: Encounter for breast and pelvic exam.  Status post robotic hysterectomy.  Ovaries remain.  Status post robotic sacrocolpopexy. Hx vaginal vault mesh erosion.  Asymptomatic.  Has consulted with the pelvic surgeon who did her surgery and she is doing observational management now for years along with vaginal estrogen treatment.  Today, I feel what I suspect are suture knots.   Vaginal atrophy. Encounter for medication monitoring.  Osteoporosis of right hip.  Osteopenia of left hip  and spine.  Hx low vit D. Intolerance to Actonel .  Caused nausea.  FH stroke.   PLAN: Mammogram screening discussed. Self breast awareness reviewed. Pap and HRV collected:  no. Not indicated. Guidelines for Calcium, Vitamin D , regular exercise program including cardiovascular and weight bearing exercise. Medication refills:  estradiol  cream 1 gram pv at hs twice weekly.  I discussed potential effect on breast cancer.  Rectocele dicussed.  Will do observational management for now.  BMD ordered at [facility].  Patient may need an office visit after BMD to discuss results.  Check vit D.  Follow up:  yearly and prn.     Additional counseling given.  yes. 30 min  total time was spent for this patient encounter, including preparation, face-to-face counseling with the patient, coordination of care, and documentation of the encounter in addition to doing the breast and pelvic exam.

## 2023-05-26 ENCOUNTER — Ambulatory Visit (INDEPENDENT_AMBULATORY_CARE_PROVIDER_SITE_OTHER): Payer: Medicare Other | Admitting: Obstetrics and Gynecology

## 2023-05-26 ENCOUNTER — Encounter: Payer: Self-pay | Admitting: Obstetrics and Gynecology

## 2023-05-26 VITALS — BP 126/82 | HR 88 | Ht 62.5 in | Wt 135.0 lb

## 2023-05-26 DIAGNOSIS — N816 Rectocele: Secondary | ICD-10-CM | POA: Diagnosis not present

## 2023-05-26 DIAGNOSIS — N952 Postmenopausal atrophic vaginitis: Secondary | ICD-10-CM | POA: Diagnosis not present

## 2023-05-26 DIAGNOSIS — M810 Age-related osteoporosis without current pathological fracture: Secondary | ICD-10-CM | POA: Diagnosis not present

## 2023-05-26 DIAGNOSIS — Z5181 Encounter for therapeutic drug level monitoring: Secondary | ICD-10-CM | POA: Diagnosis not present

## 2023-05-26 DIAGNOSIS — Z01419 Encounter for gynecological examination (general) (routine) without abnormal findings: Secondary | ICD-10-CM | POA: Diagnosis not present

## 2023-05-26 MED ORDER — ESTRADIOL 0.1 MG/GM VA CREA: TOPICAL_CREAM | VAGINAL | 2 refills | Status: AC

## 2023-05-26 NOTE — Patient Instructions (Signed)

## 2023-05-27 LAB — VITAMIN D 25 HYDROXY (VIT D DEFICIENCY, FRACTURES): Vit D, 25-Hydroxy: 27 ng/mL — ABNORMAL LOW (ref 30–100)

## 2023-06-20 ENCOUNTER — Telehealth: Payer: Self-pay | Admitting: Internal Medicine

## 2023-06-20 ENCOUNTER — Ambulatory Visit: Admitting: Medical

## 2023-06-20 VITALS — BP 132/78 | HR 88 | Temp 98.9°F | Wt 135.6 lb

## 2023-06-20 DIAGNOSIS — J019 Acute sinusitis, unspecified: Secondary | ICD-10-CM | POA: Diagnosis not present

## 2023-06-20 DIAGNOSIS — R059 Cough, unspecified: Secondary | ICD-10-CM | POA: Diagnosis not present

## 2023-06-20 LAB — POCT INFLUENZA A/B
Influenza A, POC: NEGATIVE
Influenza B, POC: NEGATIVE

## 2023-06-20 LAB — POC COVID19 BINAXNOW: SARS Coronavirus 2 Ag: NEGATIVE

## 2023-06-20 MED ORDER — BENZONATATE 100 MG PO CAPS: 100.0000 mg | ORAL_CAPSULE | Freq: Two times a day (BID) | ORAL | 0 refills | Status: DC | PRN

## 2023-06-20 MED ORDER — AMOXICILLIN 875 MG PO TABS: 875.0000 mg | ORAL_TABLET | Freq: Two times a day (BID) | ORAL | 0 refills | Status: AC

## 2023-06-20 NOTE — Progress Notes (Signed)
 Subjective:  Laura Baker is a 68 y.o. female who presents for Chief Complaint  Patient presents with   Acute Visit    Cough, congestion, sinus, and fever, Started on Monday or Tuesday of last week got worse on the weekend. Has been taking Advil cold and sinus, robitussin with honey DM daytime and nighttime. Husband was in last week and was dx with a sinus infection.      Here for illness.  Started about a week ago.  Her husband saw me 3 days ago on Friday for similar.  He is doing much better after treatment.  She notes cough, congestion, sinus pressure, had a fever last week around 99 but none now.  She has some scratchy throat.  No nausea vomiting diarrhea, no body aches or chills, no shortness of breath or wheezing.  She is using Advil Cold and Sinus, Robitussin, and water intake is good.  No other aggravating or relieving factors.    No other c/o.  Past Medical History:  Diagnosis Date   Facial basal cell cancer 2023   GERD (gastroesophageal reflux disease)    occasional tums   Grade III hemorrhoids    History of adenomatous polyp of colon    Nocturia    Osteoporosis 2017   Tubular adenoma 09/03/2022   Vitamin D  deficiency    Current Outpatient Medications on File Prior to Visit  Medication Sig Dispense Refill   Calcium Citrate-Vitamin D  (EQ CALCIUM CITRATE+D PO) Take 1 tablet by mouth 2 (two) times daily. Calcium  500mg /  Vit D  1000iu     estradiol  (ESTRACE  VAGINAL) 0.1 MG/GM vaginal cream Place 1 gram per vagina at bedtime 2 times per week. 42.5 g 2   Multiple Vitamins-Minerals (CENTRUM SILVER ADULT 50+ PO) Take 1 tablet by mouth daily.     Acetaminophen  (TYLENOL ) 325 MG CAPS as needed. (Patient not taking: Reported on 05/11/2023)     calcium carbonate (TUMS - DOSED IN MG ELEMENTAL CALCIUM) 500 MG chewable tablet Chew 1 tablet by mouth as needed. (Patient not taking: Reported on 09/07/2022)     No current facility-administered medications on file prior to visit.    The  following portions of the patient's history were reviewed and updated as appropriate: allergies, current medications, past family history, past medical history, past social history, past surgical history and problem list.  ROS Otherwise as in subjective above  Objective: BP 132/78   Pulse 88   Temp 98.9 F (37.2 C)   Wt 135 lb 9.6 oz (61.5 kg)   LMP 11/01/2008   SpO2 96%   BMI 24.41 kg/m   General appearance: alert, no distress, well developed, well nourished HEENT: normocephalic, sclerae anicteric, conjunctiva pink and moist, left TM with opacity fluid behind the eardrum, right TM flat, nares with turbinates mildly swollen, no discharge but positive erythema, pharynx with mild erythema  oral cavity: MMM, no lesions Neck: supple, no lymphadenopathy, no thyromegaly, no masses Heart: RRR, normal S1, S2, no murmurs Lungs: CTA bilaterally, no wheezes, rhonchi, or rales Pulses: 2+ radial pulses, 2+ pedal pulses, normal cap refill Ext: no edema   Assessment: Encounter Diagnoses  Name Primary?   Acute sinusitis, recurrence not specified, unspecified location Yes   Cough, unspecified type      Plan: Medications prescribed today: Amoxicillin  antibiotic.   Make sure you complete the course of antibiotics.  Specific home care recommendations today include:  You may use over-the-counter Mucinex DM or Robitussin DM for cough and congestion.  Pain/fever relief: You may use over-the-counter Acetaminophen /Tylenol  for pain or fever  You can use Tessalon  Perles cough drops as needed for cough  Drink plenty of water throughout the day.    Patient voiced understanding of plan and recommendations above.  Patient was advised to call or recheck if not much improved or worse in the next 4-5 days.    Laura Baker was seen today for acute visit.  Diagnoses and all orders for this visit:  Acute sinusitis, recurrence not specified, unspecified location  Cough, unspecified type  Other  orders -     amoxicillin  (AMOXIL ) 875 MG tablet; Take 1 tablet (875 mg total) by mouth 2 (two) times daily for 10 days. -     benzonatate  (TESSALON ) 100 MG capsule; Take 1 capsule (100 mg total) by mouth 2 (two) times daily as needed for cough.    Follow up: As needed

## 2023-06-20 NOTE — Addendum Note (Signed)
 Addended by: Charliene Conte A on: 06/20/2023 03:40 PM   Modules accepted: Orders

## 2023-06-20 NOTE — Telephone Encounter (Signed)
-----   Message from Navarino sent at 06/20/2023  1:23 PM EDT ----- Covid negative  I am treating for sinus infection, pressure behind left eardrum, cough  Medications prescribed today: Amoxicillin  antibiotic.   Make sure you complete the course of antibiotics.  Specific home care recommendations today include:  You may use over-the-counter Mucinex DM or Robitussin DM for cough and congestion.  Pain/fever relief: You may use over-the-counter Acetaminophen /Tylenol  for pain or fever  You can use Tessalon  Perles cough drops as needed for cough  Drink plenty of water throughout the day.    Call or recheck if not much improved or worse in the next 4-5 days.

## 2023-06-20 NOTE — Telephone Encounter (Signed)
 Left message for pt to call me back

## 2023-06-21 NOTE — Telephone Encounter (Signed)
 Notified pt of results and if not getting better to return/call us  back. Pt was advised that she already spoke with someone else about this. Nothing has been noted.

## 2023-06-23 DIAGNOSIS — Z08 Encounter for follow-up examination after completed treatment for malignant neoplasm: Secondary | ICD-10-CM | POA: Diagnosis not present

## 2023-06-23 DIAGNOSIS — L821 Other seborrheic keratosis: Secondary | ICD-10-CM | POA: Diagnosis not present

## 2023-06-23 DIAGNOSIS — D225 Melanocytic nevi of trunk: Secondary | ICD-10-CM | POA: Diagnosis not present

## 2023-06-23 DIAGNOSIS — L720 Epidermal cyst: Secondary | ICD-10-CM | POA: Diagnosis not present

## 2023-06-23 DIAGNOSIS — L814 Other melanin hyperpigmentation: Secondary | ICD-10-CM | POA: Diagnosis not present

## 2023-08-03 DIAGNOSIS — Z1231 Encounter for screening mammogram for malignant neoplasm of breast: Secondary | ICD-10-CM | POA: Diagnosis not present

## 2023-08-03 DIAGNOSIS — M8588 Other specified disorders of bone density and structure, other site: Secondary | ICD-10-CM | POA: Diagnosis not present

## 2023-08-03 DIAGNOSIS — Z8262 Family history of osteoporosis: Secondary | ICD-10-CM | POA: Diagnosis not present

## 2023-08-03 LAB — HM DEXA SCAN

## 2023-08-03 LAB — HM MAMMOGRAPHY

## 2023-08-08 ENCOUNTER — Ambulatory Visit: Payer: Self-pay | Admitting: Obstetrics and Gynecology

## 2023-08-10 ENCOUNTER — Ambulatory Visit: Payer: Self-pay | Admitting: Obstetrics and Gynecology

## 2023-10-20 DIAGNOSIS — K080 Exfoliation of teeth due to systemic causes: Secondary | ICD-10-CM | POA: Diagnosis not present

## 2023-11-24 ENCOUNTER — Ambulatory Visit: Admitting: Family Medicine

## 2023-11-24 ENCOUNTER — Encounter: Payer: Self-pay | Admitting: Family Medicine

## 2023-11-24 VITALS — BP 134/84 | HR 80 | Ht 62.0 in | Wt 136.0 lb

## 2023-11-24 DIAGNOSIS — Z23 Encounter for immunization: Secondary | ICD-10-CM

## 2023-11-24 DIAGNOSIS — Z860100 Personal history of colon polyps, unspecified: Secondary | ICD-10-CM

## 2023-11-24 DIAGNOSIS — Z0000 Encounter for general adult medical examination without abnormal findings: Secondary | ICD-10-CM | POA: Diagnosis not present

## 2023-11-24 DIAGNOSIS — M810 Age-related osteoporosis without current pathological fracture: Secondary | ICD-10-CM

## 2023-11-24 NOTE — Progress Notes (Signed)
 Laura Baker is a 68 y.o. female who presents for annual wellness visit and follow-up on chronic medical conditions.  She has the following concerns: Discussed the use of AI scribe software for clinical note transcription with the patient, who gave verbal consent to proceed.   She has a history of osteoporosis and is currently taking extra calcium and vitamin D  tablets. 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 She is not currently on any other osteoporosis medication and has concerns about potential side effects of other treatments. She had a bone density scan in July 2025.  She had a colonoscopy in August 2024, which revealed one adenomatous polyp, a precancerous lesion. She is scheduled for follow-up colonoscopies every five years.  She does not smoke or drink alcohol. She maintains physical activity by walking and using a treadmill, preferring outdoor walks when the weather is nice. Her current medications include over-the-counter supplements such as Centrum Silver and Estrace  vaginal cream to maintain vaginal tissue health. She does not take any other prescription medications.  She has a history of a hysterectomy and a subsequent corrective surgery two years later. She continues to see her gynecologist for follow-up care. She has a small blood vessel lesion on her lip, which has been evaluated by a dermatologist and is being left untreated.       Immunizations and Health Maintenance Immunization History  Administered Date(s) Administered   INFLUENZA, HIGH DOSE SEASONAL PF 11/24/2023   Influenza,inj,Quad PF,6+ Mos 12/27/2012, 12/04/2016, 12/03/2017, 12/08/2019, 12/06/2020   Influenza-Unspecified 01/29/2016, 12/08/2019   PFIZER(Purple Top)SARS-COV-2 Vaccination 08/02/2019, 08/23/2019   Tdap 02/02/2008, 04/21/2018   There are no preventive care reminders to  display for this patient.   Last Pap smear:2022 Last mammogram:08/2023 Last colonoscopy:09/2022 Last DEXA:08/2023 Dentist:10/2023 twice, goes every 6 mo Ophtho:yearly, goes in Dec Exercise:walks 5 days a week   Other doctors caring for patient include: GYN:Dr. Nikki DERM: Dr. Elnor @ [facility]: Dr. Kristie Optho: Dr. Malvina Dentist: [facility] Surgeon: Dr. Lavonia Ortho: [facility]   Advanced directives:None. Info given Does Patient Have a Medical Advance Directive?: No Would patient like information on creating a medical advance directive?: Yes (MAU/Ambulatory/Procedural Areas - Information given)  Depression screen:  See questionnaire below.     11/24/2023   11:28 AM 05/11/2023    9:21 AM 05/12/2022    8:57 AM 04/28/2022    9:18 AM 04/23/2021   10:54 AM  Depression screen PHQ 2/9  Decreased Interest 0 0 0 0 0  Down, Depressed, Hopeless 0 0 0 0 0  PHQ - 2 Score 0 0 0 0 0    Fall Risk Screen: see questionnaire below.    11/24/2023   11:27 AM 05/26/2023   10:55 AM 05/11/2023    9:20 AM 05/12/2022    8:57 AM 04/28/2022    9:18 AM  Fall Risk   Falls in the past year? 0 0 0 0 0  Number falls in past yr: 0 0 0 0 0  Injury with Fall? 0 0  0 0  Risk for fall due to : No Fall Risks No Fall Risks History of fall(s) No Fall Risks No Fall Risks  Follow up Falls evaluation completed Falls evaluation completed Falls evaluation completed Falls evaluation completed Falls evaluation completed    ADL screen:  See questionnaire below Functional Status Survey: Is the patient deaf or have difficulty hearing?: No Does  the patient have difficulty seeing, even when wearing glasses/contacts?: Yes (wears reading glasses and has some issues with night vision) Does the patient have difficulty concentrating, remembering, or making decisions?: No Does the patient have difficulty walking or climbing stairs?: No Does the patient have difficulty dressing or bathing?: No Does the patient have  difficulty doing errands alone such as visiting a doctor's office or shopping?: NoNegative   Review of Systems Constitutional: -, -unexpected weight change, -anorexia, -fatigue Allergy: -sneezing, -itching, -congestion Dermatology: denies changing moles, rash, lumps ENT: -runny nose, -ear pain, -sore throat,  Cardiology:  -chest pain, -palpitations, -orthopnea, Respiratory: -cough, -shortness of breath, -dyspnea on exertion, -wheezing,  Gastroenterology: -abdominal pain, -nausea, -vomiting, -diarrhea, -constipation, -dysphagia Hematology: -bleeding or bruising problems Musculoskeletal: -arthralgias, -myalgias, -joint swelling, -back pain, - Ophthalmology: -vision changes,  Urology: -dysuria, -difficulty urinating,  -urinary frequency, -urgency, incontinence Neurology: -, -numbness, , -memory loss, -falls, -dizziness    PHYSICAL EXAM:  BP 134/84   Pulse 80   Ht 5' 2 (1.575 m)   Wt 136 lb (61.7 kg)   LMP 11/01/2008   BMI 24.87 kg/m   General Appearance: Alert, cooperative, no distress, appears stated age Head: Normocephalic, without obvious abnormality, atraumatic Eyes: PERRL, conjunctiva/corneas clear, EOM's intact,  Ears: Normal TM's and external ear canals Nose: Nares normal, mucosa normal, no drainage or sinus tenderness Throat: Lips, mucosa, and tongue normal; teeth and gums normal Neck: Supple, no lymphadenopathy;  thyroid :  no enlargement/tenderness/nodules; no carotid bruit or JVD Lungs: Clear to auscultation bilaterally without wheezes, rales or ronchi; respirations unlabored Heart: Regular rate and rhythm, S1 and S2 normal, no murmur, rubor gallop Skin:  Skin color, texture, turgor normal, no rashes or lesions.  Lower lip lesion noted Lymph nodes: Cervical, supraclavicular, and axillary nodes normal Neurologic:  CNII-XII intact, normal strength, sensation and gait; reflexes 2+ and symmetric throughout Psych: Normal mood, affect, hygiene and  grooming.  ASSESSMENT/PLAN: Medicare annual wellness visit, subsequent  History of colonic polyps - adenomatous  Need for influenza vaccination - Plan: Flu vaccine HIGH DOSE PF(Fluzone Trivalent)  Osteoporosis without current pathological fracture, unspecified osteoporosis type    Discussed  at least 30 minutes of aerobic activity at least 5 days/week and weight-bearing exercise 2x/week; healthy diet, including goals of calcium and vitamin D  intake and alcohol recommendations (less than or equal to 1 drink/day) reviewed; rImmunization recommendations discussed.  Colonoscopy recommendations reviewed   Medicare Attestation I have personally reviewed: The patient's medical and social history Their use of alcohol, tobacco or illicit drugs Their current medications and supplements The patient's functional ability including ADLs,fall risks, home safety risks, cognitive, and hearing and visual impairment Diet and physical activities Evidence for depression or mood disorders  The patient's weight, height, and BMI have been recorded in the chart.  I have made referrals, counseling, and provided education to the patient based on review of the above and I have provided the patient with a written personalized care plan for preventive services.     Norleen Jobs, MD   11/24/2023

## 2023-11-24 NOTE — Progress Notes (Signed)
 Laura Baker is a 68 y.o. female who presents for annual wellness visit and follow-up on chronic medical conditions.  She has the following concerns:  Immunizations and Health Maintenance Immunization History  Administered Date(s) Administered   Influenza,inj,Quad PF,6+ Mos 12/27/2012, 12/04/2016, 12/03/2017, 12/08/2019, 12/06/2020   Influenza-Unspecified 01/29/2016, 12/08/2019   PFIZER(Purple Top)SARS-COV-2 Vaccination 08/02/2019, 08/23/2019   Tdap 02/02/2008, 04/21/2018   Health Maintenance Due  Topic Date Due   Pneumococcal Vaccine: 50+ Years (1 of 1 - PCV) Never done   Zoster Vaccines- Shingrix (1 of 2) Never done   Medicare Annual Wellness (AWV)  04/28/2023   Influenza Vaccine  09/02/2023   COVID-19 Vaccine (3 - 2025-26 season) 10/03/2023    Last Pap smear: Last mammogram: Last colonoscopy:2024 Last [id number] Dentist:[facility]: [place] Exercise:walking  Other doctors caring for patient include:[facility]  Advanced directives:info given    Depression screen:  See questionnaire below.     05/11/2023    9:21 AM 05/12/2022    8:57 AM 04/28/2022    9:18 AM 04/23/2021   10:54 AM 04/18/2020   11:54 AM  Depression screen PHQ 2/9  Decreased Interest 0 0 0 0 0  Down, Depressed, Hopeless 0 0 0 0 0  PHQ - 2 Score 0 0 0 0 0    Fall Risk Screen: see questionnaire below.    05/26/2023   10:55 AM 05/11/2023    9:20 AM 05/12/2022    8:57 AM 04/28/2022    9:18 AM 04/29/2021    3:24 PM  Fall Risk   Falls in the past year? 0 0 0 0 0  Number falls in past yr: 0 0 0 0 0  Injury with Fall? 0  0 0 0  Risk for fall due to : No Fall Risks History of fall(s) No Fall Risks No Fall Risks No Fall Risks  Follow up Falls evaluation completed Falls evaluation completed Falls evaluation completed Falls evaluation completed Falls evaluation completed      Data saved with a previous flowsheet row definition    ADL screen:  See questionnaire below Functional Status Survey:   nefg  Review of Systems Constitutional: -, -unexpected weight change, -anorexia, -fatigue Allergy: -sneezing, -itching, -congestion Dermatology: denies changing moles, rash, lumps ENT: -runny nose, -ear pain, -sore throat,  Cardiology:  -chest pain, -palpitations, -orthopnea, Respiratory: -cough, -shortness of breath, -dyspnea on exertion, -wheezing,  Gastroenterology: -abdominal pain, -nausea, -vomiting, -diarrhea, -constipation, -dysphagia Hematology: -bleeding or bruising problems Musculoskeletal: -arthralgias, -myalgias, -joint swelling, -back pain, - Ophthalmology: -vision changes,  Urology: -dysuria, -difficulty urinating,  -urinary frequency, -urgency, incontinence Neurology: -, -numbness, , -memory loss, -falls, -dizziness    PHYSICAL EXAM:  LMP 11/01/2008   General Appearance: Alert, cooperative, no distress, appears stated age Head: Normocephalic, without obvious abnormality, atraumatic Eyes: PERRL, conjunctiva/corneas clear, EOM's intact, fundi benign Ears: Normal TM's and external ear canals Nose: Nares normal, mucosa normal, no drainage or sinus tenderness Throat: Lips, mucosa, and tongue normal; teeth and gums normal Neck: Supple, no lymphadenopathy;  thyroid :  no enlargement/tenderness/nodules; no carotid bruit or JVD Lungs: Clear to auscultation bilaterally without wheezes, rales or ronchi; respirations unlabored Heart: Regular rate and rhythm, S1 and S2 normal, no murmur, rubor gallop Abdomen: Soft, non-tender, nondistended, normoactive bowel sounds,  no masses, no hepatosplenomegaly Extremities: No clubbing, cyanosis or edema Pulses: 2+ and symmetric all extremities Skin:  Skin color, texture, turgor normal, no rashes or lesions Lymph nodes: Cervical, supraclavicular, and axillary nodes normal Neurologic:  CNII-XII intact, normal strength, sensation  and gait; reflexes 2+ and symmetric throughout Psych: Normal mood, affect, hygiene and  grooming.  ASSESSMENT/PLAN:    Discussed monthly self breast exams and yearly mammograms; at least 30 minutes of aerobic activity at least 5 days/week and weight-bearing exercise 2x/week; proper sunscreen use reviewed; healthy diet, including goals of calcium and vitamin D  intake and alcohol recommendations (less than or equal to 1 drink/day) reviewed; regular seatbelt use; changing batteries in smoke detectors.  Immunization recommendations discussed.  Colonoscopy recommendations reviewed   Medicare Attestation I have personally reviewed: The patient's medical and social history Their use of alcohol, tobacco or illicit drugs Their current medications and supplements The patient's functional ability including ADLs,fall risks, home safety risks, cognitive, and hearing and visual impairment Diet and physical activities Evidence for depression or mood disorders  The patient's weight, height, and BMI have been recorded in the chart.  I have made referrals, counseling, and provided education to the patient based on review of the above and I have provided the patient with a written personalized care plan for preventive services.     Norleen Jobs, MD   11/24/2023

## 2024-05-16 ENCOUNTER — Encounter: Payer: Self-pay | Admitting: Family Medicine

## 2024-05-31 ENCOUNTER — Ambulatory Visit: Admitting: Obstetrics and Gynecology
